# Patient Record
Sex: Female | Born: 1980 | Race: Black or African American | Hispanic: No | Marital: Single | Smoking: Former smoker
Health system: Southern US, Community
[De-identification: ages and names within clinical notes are randomized; demographics above are authoritative.]

## PROBLEM LIST (undated history)

## (undated) ENCOUNTER — Inpatient Hospital Stay (HOSPITAL_COMMUNITY): Payer: Self-pay

## (undated) DIAGNOSIS — A539 Syphilis, unspecified: Secondary | ICD-10-CM

## (undated) DIAGNOSIS — B9689 Other specified bacterial agents as the cause of diseases classified elsewhere: Secondary | ICD-10-CM

## (undated) DIAGNOSIS — N760 Acute vaginitis: Secondary | ICD-10-CM

## (undated) DIAGNOSIS — Z789 Other specified health status: Secondary | ICD-10-CM

## (undated) HISTORY — PX: DILATION AND CURETTAGE OF UTERUS: SHX78

---
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
## 2004-12-10 ENCOUNTER — Emergency Department (HOSPITAL_COMMUNITY): Admission: EM | Admit: 2004-12-10 | Discharge: 2004-12-10 | Payer: Self-pay | Admitting: Family Medicine

## 2005-01-13 ENCOUNTER — Ambulatory Visit (HOSPITAL_COMMUNITY): Admission: RE | Admit: 2005-01-13 | Discharge: 2005-01-13 | Payer: Self-pay | Admitting: *Deleted

## 2005-02-18 ENCOUNTER — Other Ambulatory Visit: Admission: RE | Admit: 2005-02-18 | Discharge: 2005-02-18 | Payer: Self-pay | Admitting: Obstetrics and Gynecology

## 2005-04-09 ENCOUNTER — Inpatient Hospital Stay (HOSPITAL_COMMUNITY): Admission: AD | Admit: 2005-04-09 | Discharge: 2005-04-09 | Payer: Self-pay | Admitting: Obstetrics and Gynecology

## 2005-04-16 ENCOUNTER — Inpatient Hospital Stay (HOSPITAL_COMMUNITY): Admission: AD | Admit: 2005-04-16 | Discharge: 2005-04-20 | Payer: Self-pay | Admitting: *Deleted

## 2005-04-17 ENCOUNTER — Encounter (INDEPENDENT_AMBULATORY_CARE_PROVIDER_SITE_OTHER): Payer: Self-pay | Admitting: Specialist

## 2005-05-17 ENCOUNTER — Other Ambulatory Visit: Admission: RE | Admit: 2005-05-17 | Discharge: 2005-05-17 | Payer: Self-pay | Admitting: Obstetrics and Gynecology

## 2006-08-06 ENCOUNTER — Emergency Department (HOSPITAL_COMMUNITY): Admission: EM | Admit: 2006-08-06 | Discharge: 2006-08-06 | Payer: Self-pay | Admitting: Family Medicine

## 2008-05-01 ENCOUNTER — Emergency Department (HOSPITAL_COMMUNITY): Admission: EM | Admit: 2008-05-01 | Discharge: 2008-05-01 | Payer: Self-pay | Admitting: Emergency Medicine

## 2008-11-18 ENCOUNTER — Emergency Department (HOSPITAL_COMMUNITY): Admission: EM | Admit: 2008-11-18 | Discharge: 2008-11-18 | Payer: Self-pay | Admitting: Family Medicine

## 2009-02-09 ENCOUNTER — Emergency Department (HOSPITAL_COMMUNITY): Admission: EM | Admit: 2009-02-09 | Discharge: 2009-02-09 | Payer: Self-pay | Admitting: Family Medicine

## 2009-02-10 ENCOUNTER — Emergency Department (HOSPITAL_COMMUNITY): Admission: EM | Admit: 2009-02-10 | Discharge: 2009-02-10 | Payer: Self-pay | Admitting: Family Medicine

## 2009-05-19 ENCOUNTER — Emergency Department (HOSPITAL_COMMUNITY): Admission: EM | Admit: 2009-05-19 | Discharge: 2009-05-19 | Payer: Self-pay | Admitting: Emergency Medicine

## 2009-11-04 ENCOUNTER — Inpatient Hospital Stay (HOSPITAL_COMMUNITY): Admission: AD | Admit: 2009-11-04 | Discharge: 2009-11-04 | Payer: Self-pay | Admitting: Obstetrics & Gynecology

## 2009-11-04 ENCOUNTER — Ambulatory Visit: Payer: Self-pay | Admitting: Family Medicine

## 2009-12-05 ENCOUNTER — Inpatient Hospital Stay (HOSPITAL_COMMUNITY): Admission: AD | Admit: 2009-12-05 | Discharge: 2009-12-05 | Payer: Self-pay | Admitting: Obstetrics & Gynecology

## 2009-12-05 ENCOUNTER — Ambulatory Visit: Payer: Self-pay | Admitting: Obstetrics & Gynecology

## 2010-02-12 ENCOUNTER — Inpatient Hospital Stay (HOSPITAL_COMMUNITY)
Admission: AD | Admit: 2010-02-12 | Discharge: 2010-02-15 | Payer: Self-pay | Source: Home / Self Care | Attending: Obstetrics and Gynecology | Admitting: Obstetrics and Gynecology

## 2010-02-13 ENCOUNTER — Encounter (INDEPENDENT_AMBULATORY_CARE_PROVIDER_SITE_OTHER): Payer: Self-pay | Admitting: Obstetrics and Gynecology

## 2010-05-18 LAB — CBC
HCT: 27.4 % — ABNORMAL LOW (ref 36.0–46.0)
HCT: 32.5 % — ABNORMAL LOW (ref 36.0–46.0)
Hemoglobin: 11.1 g/dL — ABNORMAL LOW (ref 12.0–15.0)
Hemoglobin: 9.4 g/dL — ABNORMAL LOW (ref 12.0–15.0)
MCH: 32.9 pg (ref 26.0–34.0)
MCH: 32.9 pg (ref 26.0–34.0)
MCHC: 34.2 g/dL (ref 30.0–36.0)
MCHC: 34.3 g/dL (ref 30.0–36.0)
MCV: 95.9 fL (ref 78.0–100.0)
MCV: 96 fL (ref 78.0–100.0)
Platelets: 204 10*3/uL (ref 150–400)
Platelets: 234 10*3/uL (ref 150–400)
RBC: 2.85 MIL/uL — ABNORMAL LOW (ref 3.87–5.11)
RBC: 3.38 MIL/uL — ABNORMAL LOW (ref 3.87–5.11)
RDW: 13.5 % (ref 11.5–15.5)
RDW: 13.8 % (ref 11.5–15.5)
WBC: 6.5 10*3/uL (ref 4.0–10.5)
WBC: 7.2 10*3/uL (ref 4.0–10.5)

## 2010-05-18 LAB — RPR: RPR Ser Ql: NONREACTIVE

## 2010-05-18 LAB — HEMOGLOBIN A1C
Hgb A1c MFr Bld: 5.4 % (ref <5.7)
Mean Plasma Glucose: 108 mg/dL (ref <117)

## 2010-05-21 LAB — DIFFERENTIAL
Eosinophils Absolute: 0.1 10*3/uL (ref 0.0–0.7)
Lymphocytes Relative: 29 % (ref 12–46)
Lymphs Abs: 2.1 10*3/uL (ref 0.7–4.0)
Monocytes Relative: 8 % (ref 3–12)
Neutro Abs: 4.4 10*3/uL (ref 1.7–7.7)
Neutrophils Relative %: 62 % (ref 43–77)

## 2010-05-21 LAB — URINALYSIS, ROUTINE W REFLEX MICROSCOPIC
Ketones, ur: NEGATIVE mg/dL
Nitrite: NEGATIVE
Nitrite: NEGATIVE
Protein, ur: NEGATIVE mg/dL
Specific Gravity, Urine: 1.02 (ref 1.005–1.030)
Urobilinogen, UA: 0.2 mg/dL (ref 0.0–1.0)
pH: 7.5 (ref 5.0–8.0)
pH: 6.5 (ref 5.0–8.0)

## 2010-05-21 LAB — WET PREP, GENITAL: Yeast Wet Prep HPF POC: NONE SEEN

## 2010-05-21 LAB — HIV ANTIBODY (ROUTINE TESTING W REFLEX): HIV: NONREACTIVE

## 2010-05-21 LAB — CBC
Hemoglobin: 9.9 g/dL — ABNORMAL LOW (ref 12.0–15.0)
Platelets: 225 10*3/uL (ref 150–400)
RBC: 3.02 MIL/uL — ABNORMAL LOW (ref 3.87–5.11)
WBC: 7.1 10*3/uL (ref 4.0–10.5)

## 2010-05-21 LAB — SICKLE CELL SCREEN: Sickle Cell Screen: NEGATIVE

## 2010-05-21 LAB — RPR: RPR Ser Ql: NONREACTIVE

## 2010-05-21 LAB — HEPATITIS B SURFACE ANTIGEN: Hepatitis B Surface Ag: NEGATIVE

## 2010-05-29 LAB — POCT URINALYSIS DIP (DEVICE)
Glucose, UA: NEGATIVE mg/dL
Nitrite: NEGATIVE
Urobilinogen, UA: 0.2 mg/dL (ref 0.0–1.0)

## 2010-05-29 LAB — WET PREP, GENITAL
Clue Cells Wet Prep HPF POC: NONE SEEN
WBC, Wet Prep HPF POC: NONE SEEN

## 2010-05-29 LAB — GC/CHLAMYDIA PROBE AMP, GENITAL: Chlamydia, DNA Probe: NEGATIVE

## 2010-05-29 LAB — POCT PREGNANCY, URINE: Preg Test, Ur: NEGATIVE

## 2010-06-09 LAB — WET PREP, GENITAL: Yeast Wet Prep HPF POC: NONE SEEN

## 2010-06-09 LAB — POCT URINALYSIS DIP (DEVICE)
Hgb urine dipstick: NEGATIVE
Ketones, ur: 15 mg/dL — AB
Specific Gravity, Urine: 1.015 (ref 1.005–1.030)
pH: 6 (ref 5.0–8.0)

## 2010-06-09 LAB — T.PALLIDUM AB, IGG: T pallidum Antibodies (TP-PA): 23.7 IV — ABNORMAL HIGH (ref <1.0)

## 2010-06-09 LAB — RPR TITER: RPR Titer: 1:128 {titer} — AB

## 2010-07-24 NOTE — Op Note (Signed)
[name], [name] [name] NO.:  [id number]   MEDICAL RECORD NO.:  [id number]          PATIENT TYPE:  INP   LOCATION:  9124                          FACILITY:  [facility]   PHYSICIAN:  James A. Ashley Royalty, M.D.DATE OF BIRTH:  1980-05-08   DATE OF PROCEDURE:  04/17/2005  DATE OF DISCHARGE:                                 OPERATIVE REPORT   PREOPERATIVE DIAGNOSIS:  1.  Intrauterine pregnancy at 39 weeks' gestation.  2.  Premature rupture of membranes.  3.  Nonreassuring fetal heart tracing and labor.   POSTOPERATIVE DIAGNOSIS:  1.  Intrauterine pregnancy at 24 weeks' gestation.  2.  Premature rupture of membranes.  3.  Nonreassuring fetal heart tracing and labor.   PROCEDURE:  Primary low transverse cesarean section.   SURGEON:  Rudy Jew. Ashley Royalty, M.D.   ANESTHESIA:  Epidural   FINDINGS:  5 pounds 14 ounces female Apgars 8 at 1 minute and 9 at 5 minutes  sent to newborn nursery. Arterial cord pH 7.34.   ESTIMATED BLOOD LOSS:  600 mL.   COMPLICATIONS:  None.   DRAINS:  Foley.   Needle, sponge, and instrument counts correct x2.   PROCEDURE:  The patient was taken to the operating room, placed in the  dorsosupine position. Epidural anesthetic was dosed to surgical levels. She  was prepped and draped in usual manner for abdominal surgery. Foley catheter  had been previously placed.   Pfannenstiel incision was made down to the level of the fascia which was  nicked with knife and incised transversely with Mayo scissors. The  underlying rectus muscles were separated from the fascia using sharp and  blunt dissection. Rectus muscles were separated in midline exposing  peritoneum which was elevated with hemostats and entered atraumatically with  Metzenbaum scissors. The incision was extended longitudinally. Uterus was  identified and bladder flap created by incising intrauterine serosa and  sharply and bluntly dissecting the bladder inferiorly. It was held in place  with  bladder blade. The uterus then entered through a low transverse  incision using sharp and blunt dissection. Fluid was noted to be clear. The  infant was delivered from vertex presentation. The infant was suctioned. The  cord was triply clamped, cut and infant given immediately to the awaiting  pediatrics team. Arterial cord pH was obtained from isolated segment of  umbilical cord. And regular cord blood was obtained. Placenta and membranes  were removed in their entirety and submitted to pathology for histologic  studies. Uterus was exteriorized.   The uterus was then closed in two running layers of #1 Vicryl. The first was  running locking layer. The second was a running, intermittently locking, and  imbricating layer. One additional figure-of-eight suture was required to  obtain hemostasis. Hemostasis was noted. Uterus, tubes and ovaries inspected  and found to be otherwise unremarkable. They were returned to the abdominal  cavity. Copious irrigation was accomplished. Hemostasis was noted.  Peritoneum was then closed with 3-0 Vicryl in running fashion. The fascia  was closed with 0 Vicryl in a running fashion. The skin was closed with  staples. The patient  tolerated procedure extremely well. At the conclusion  of procedure the urine was clear and copious.   The patient was then taken to the recovery room in excellent condition.      James A. Ashley Royalty, M.D.  Electronically Signed     JAM/MEDQ  D:  04/17/2005  T:  04/18/2005  Job:  [id number]

## 2010-07-24 NOTE — Discharge Summary (Signed)
[name] [name], [name] [name] NO.:  [id number]   MEDICAL RECORD NO.:  [id number]          PATIENT TYPE:  INP   LOCATION:  9124                          FACILITY:  [facility]   PHYSICIAN:  James A. Ashley Royalty, M.D.DATE OF BIRTH:  May 25, 1980   DATE OF ADMISSION:  04/16/2005  DATE OF DISCHARGE:  04/20/2005                                 DISCHARGE SUMMARY   DISCHARGE DIAGNOSES:  1.  Intrauterine pregnancy at [redacted] weeks gestation.  2.  Premature rupture of membranes.  3.  Nonreassuring fetal heart tracing necessitating cesarean section.   OPERATION:  Primary low transverse cesarean section.   CONSULTATIONS:  None.   DISCHARGE MEDICATIONS:  1.  Motrin 600 mg.  2.  Chromagen 1 p.o. daily.   HISTORY OF PRESENT ILLNESS:  This is a 30 year old, gravida 2, para 0-0-1-0  at [redacted] weeks gestation. Prenatal care complicated by late transfer from the  [facility] to [facility],  marijuana use during pregnancy, history of abnormal Paps, history of  trichomonas, treated, history of group B strep in early pregnancy. The  patient presented to the office complaining of possible rupture of membranes  occuring at 3 a.m. on the day of presentation. Rupture of membranes was not  proven in the office. However, the patient went to maternity admissions  later in the day on the day of admission and was noted to be fern positive  at that time. For the remainder of the history and physical, please see  chart.   HOSPITAL COURSE:  The patient was admitted to [facility]. Admission laboratory studies were drawn. On April 17, 2005,  she was noted to be 3 cm dilated, 90% effaced, -1 station, vertex  presentation, intrauterine pressure catheter was placed. Antibiotic  prophylaxis was given. At 11:40 a.m. on April 17, 2005, I was called  regarding evaluation of the fetal heart pattern. At that time, the cervix  was 4-5 cm dilated, 90%  effaced, 0 station, and a vertex presentation.  Variable decelerations were noted. Close observation was continued. At 12:15  on the same day, the patient was noted to have late decelerations which were  persistent despite conservative measures. Hence a decision was made to  proceed with cesarean section. On April 17, 2005, the patient was taken  to the operating room and underwent primary low transverse cesarean section.  The procedure yielded a 5 pound 14 ounce female, Apgar 8 at 1 minute, 9 at 5  minutes, sent to the newborn nursery. The delivery was accomplished by Dr.  Sylvester Harder. The patient's postoperative course was benign except for an  asymptomatic anemia. She was discharged home April 20, 2005, afebrile and  in satisfactory condition on the aforementioned medications. Last hemoglobin  obtained April 20, 2005 was 8.4.   DISPOSITION:  The patient is to return to the [facility] in approximately 6 weeks for postpartum evaluation.      James A. Ashley Royalty, M.D.  Electronically Signed     JAM/MEDQ  D:  06/07/2005  T:  06/09/2005  Job:  [id number]

## 2010-07-24 NOTE — Op Note (Signed)
[facility]  Patient:    [name] [name], [name] [name]                        MRN: [id number] Proc. Date: 05/18/00 Adm. Date:  [id number] Attending:  Tammi Sou CC:         [facility]   Operative Report  PREOPERATIVE DIAGNOSIS:       Nine week intrauterine fetal demise by ultrasound.  POSTOPERATIVE DIAGNOSIS:      Nine week intrauterine fetal demise by ultrasound.  PROCEDURE:                    Suction dilation and evacuation.  SURGEON:                      Charles A. Clearance Coots, M.D.  ANESTHESIA:                   MAC with paracervical block.  ESTIMATED BLOOD LOSS:         100 ml.  COMPLICATIONS:                None.  SPECIMENS:                    Products of conception.  DESCRIPTION OF PROCEDURE:     The patient was brought to the operating room and, after satisfactory IV sedation in the supine position, the legs were brought up in stirrups and the vagina was prepped and draped in the usual sterile fashion.  The urinary bladder was emptied of approximately 25 cc of clear urine.  Bimanual examination revealed the uterus to be mid position and approximately 9-10 weeks in size.  A sterile speculum was inserted in the vaginal vault and the cervix was isolated.  The anterior lip of the cervix was grasped with a single tooth tenaculum.  Paracervical block of 2% Xylocaine with sodium bicarbonate was injected in the cervical stroma in the lateral fornices at the 3 and 9 oclock positions with approximately 8 ml at 3 and 9 oclock.  Approximate 4-5 ml was injected into the anterior lip of the cervix prior to application of the single tooth tenaculum.  The uterus was sounded to confirm the axis.  The cervix was dilated to a #25 Pratt dilator.  A #8 suction catheter was easily introduced into the uterine cavity and all contents were evacuated.  The uterus contracted down quite well.  The endometrial surface was curetted with a  small serrated curet.  No further products of conception were obtained.  The endometrial surface felt gritty throughout.  There was no active bleeding noted.  All instruments were then retired.  The patient tolerated the procedure well and was transported to the recovery room in satisfactory condition. DD:  05/18/00 TD:  05/19/00 Job: [id number]

## 2010-11-23 ENCOUNTER — Emergency Department (HOSPITAL_COMMUNITY)
Admission: EM | Admit: 2010-11-23 | Discharge: 2010-11-24 | Disposition: A | Discharge status: Home / Self Care | Payer: Self-pay | Attending: Emergency Medicine | Admitting: Emergency Medicine

## 2010-11-23 ENCOUNTER — Ambulatory Visit (HOSPITAL_COMMUNITY): Payer: Self-pay

## 2010-11-23 DIAGNOSIS — L293 Anogenital pruritus, unspecified: Secondary | ICD-10-CM | POA: Insufficient documentation

## 2010-11-23 DIAGNOSIS — A499 Bacterial infection, unspecified: Secondary | ICD-10-CM | POA: Insufficient documentation

## 2010-11-23 DIAGNOSIS — N760 Acute vaginitis: Secondary | ICD-10-CM | POA: Insufficient documentation

## 2010-11-23 DIAGNOSIS — B9689 Other specified bacterial agents as the cause of diseases classified elsewhere: Secondary | ICD-10-CM | POA: Insufficient documentation

## 2010-11-23 LAB — URINALYSIS, ROUTINE W REFLEX MICROSCOPIC
Glucose, UA: NEGATIVE mg/dL
Leukocytes, UA: NEGATIVE
Nitrite: NEGATIVE
Specific Gravity, Urine: 1.038 — ABNORMAL HIGH (ref 1.005–1.030)
pH: 5.5 (ref 5.0–8.0)

## 2010-11-23 LAB — URINE MICROSCOPIC-ADD ON

## 2010-11-24 LAB — GC/CHLAMYDIA PROBE AMP, GENITAL
Chlamydia, DNA Probe: NEGATIVE
GC Probe Amp, Genital: NEGATIVE

## 2012-02-09 ENCOUNTER — Encounter (HOSPITAL_COMMUNITY): Payer: Self-pay | Admitting: Emergency Medicine

## 2012-02-09 ENCOUNTER — Emergency Department (HOSPITAL_COMMUNITY)
Admission: EM | Admit: 2012-02-09 | Discharge: 2012-02-09 | Disposition: A | Discharge status: Home / Self Care | Payer: Self-pay | Attending: Emergency Medicine | Admitting: Emergency Medicine

## 2012-02-09 DIAGNOSIS — H1045 Other chronic allergic conjunctivitis: Secondary | ICD-10-CM | POA: Insufficient documentation

## 2012-02-09 DIAGNOSIS — Z3202 Encounter for pregnancy test, result negative: Secondary | ICD-10-CM | POA: Insufficient documentation

## 2012-02-09 DIAGNOSIS — F17200 Nicotine dependence, unspecified, uncomplicated: Secondary | ICD-10-CM | POA: Insufficient documentation

## 2012-02-09 DIAGNOSIS — H1011 Acute atopic conjunctivitis, right eye: Secondary | ICD-10-CM

## 2012-02-09 DIAGNOSIS — H81399 Other peripheral vertigo, unspecified ear: Secondary | ICD-10-CM | POA: Insufficient documentation

## 2012-02-09 LAB — POCT PREGNANCY, URINE: Preg Test, Ur: NEGATIVE

## 2012-02-09 MED ORDER — OLOPATADINE HCL 0.1 % OP SOLN: 1.0000 [drp] | Freq: Two times a day (BID) | OPHTHALMIC | Status: DC

## 2012-02-09 MED ORDER — MECLIZINE HCL 25 MG PO TABS: 25.0000 mg | ORAL_TABLET | Freq: Four times a day (QID) | ORAL | Status: DC | PRN

## 2012-02-09 NOTE — ED Notes (Signed)
Pt c/o right eye redness and irritation x 3 days; pt sts thinks it could be pink eye and pt also requests pregnancy test
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
## 2012-02-19 ENCOUNTER — Emergency Department (HOSPITAL_COMMUNITY): Payer: Self-pay

## 2012-02-19 ENCOUNTER — Emergency Department (HOSPITAL_COMMUNITY)
Admission: EM | Admit: 2012-02-19 | Discharge: 2012-02-19 | Disposition: A | Discharge status: Home / Self Care | Payer: Self-pay | Attending: Emergency Medicine | Admitting: Emergency Medicine

## 2012-02-19 ENCOUNTER — Encounter (HOSPITAL_COMMUNITY): Payer: Self-pay | Admitting: Physical Medicine and Rehabilitation

## 2012-02-19 DIAGNOSIS — S62609A Fracture of unspecified phalanx of unspecified finger, initial encounter for closed fracture: Secondary | ICD-10-CM

## 2012-02-19 DIAGNOSIS — F17200 Nicotine dependence, unspecified, uncomplicated: Secondary | ICD-10-CM | POA: Insufficient documentation

## 2012-02-19 MED ORDER — HYDROCODONE-ACETAMINOPHEN 5-325 MG PO TABS
2.0000 | ORAL_TABLET | Freq: Once | ORAL | Status: AC
  Administered 2012-02-19: 2 via ORAL
  Filled 2012-02-19: qty 2

## 2012-02-19 MED ORDER — HYDROCODONE-ACETAMINOPHEN 5-500 MG PO TABS: 1.0000 | ORAL_TABLET | Freq: Four times a day (QID) | ORAL | Status: DC | PRN
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
## 2012-02-19 NOTE — ED Notes (Signed)
Paged ortho 

## 2012-02-19 NOTE — ED Notes (Signed)
Ortho at bedside applying splint now.
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
## 2012-02-19 NOTE — ED Notes (Signed)
Spoke with ortho, on way to put splint on pt's hand.

## 2012-02-19 NOTE — Progress Notes (Signed)
Orthopedic Tech Progress Note Patient Details:  KENDA KLOEHN 1980-03-10 [id number]  Ortho Devices Type of Ortho Device: Ulna gutter splint Ortho Device/Splint Location: right ulna gutter splint Ortho Device/Splint Interventions: Application   Cammer, Mickie Bail 02/19/2012, 2:31 PM

## 2012-02-22 ENCOUNTER — Emergency Department (HOSPITAL_COMMUNITY)
Admission: EM | Admit: 2012-02-22 | Discharge: 2012-02-22 | Disposition: A | Discharge status: Home / Self Care | Payer: Self-pay | Attending: Emergency Medicine | Admitting: Emergency Medicine

## 2012-02-22 ENCOUNTER — Encounter (HOSPITAL_COMMUNITY): Payer: Self-pay | Admitting: *Deleted

## 2012-02-22 DIAGNOSIS — R350 Frequency of micturition: Secondary | ICD-10-CM | POA: Insufficient documentation

## 2012-02-22 DIAGNOSIS — N390 Urinary tract infection, site not specified: Secondary | ICD-10-CM

## 2012-02-22 DIAGNOSIS — F17200 Nicotine dependence, unspecified, uncomplicated: Secondary | ICD-10-CM | POA: Insufficient documentation

## 2012-02-22 DIAGNOSIS — R319 Hematuria, unspecified: Secondary | ICD-10-CM | POA: Insufficient documentation

## 2012-02-22 LAB — URINALYSIS, ROUTINE W REFLEX MICROSCOPIC
Bilirubin Urine: NEGATIVE
Nitrite: POSITIVE — AB
Protein, ur: 100 mg/dL — AB
Specific Gravity, Urine: 1.027 (ref 1.005–1.030)
Urobilinogen, UA: 1 mg/dL (ref 0.0–1.0)

## 2012-02-22 LAB — URINE MICROSCOPIC-ADD ON

## 2012-02-22 LAB — PREGNANCY, URINE: Preg Test, Ur: NEGATIVE

## 2012-02-22 MED ORDER — KETOROLAC TROMETHAMINE 30 MG/ML IJ SOLN
60.0000 mg | Freq: Once | INTRAMUSCULAR | Status: AC
  Administered 2012-02-22: 60 mg via INTRAMUSCULAR
  Filled 2012-02-22: qty 2

## 2012-02-22 MED ORDER — LIDOCAINE HCL (PF) 1 % IJ SOLN
INTRAMUSCULAR | Status: AC
  Administered 2012-02-22: 2.1 mL
  Filled 2012-02-22: qty 5

## 2012-02-22 MED ORDER — CEFTRIAXONE SODIUM 1 G IJ SOLR
1.0000 g | Freq: Once | INTRAMUSCULAR | Status: AC
  Administered 2012-02-22: 1 g via INTRAMUSCULAR
  Filled 2012-02-22: qty 10

## 2012-02-22 MED ORDER — NITROFURANTOIN MONOHYD MACRO 100 MG PO CAPS: 100.0000 mg | ORAL_CAPSULE | Freq: Two times a day (BID) | ORAL | Status: DC

## 2012-02-22 NOTE — ED Notes (Signed)
The pt has some  Stinging pain when she stops voiding that started yesterday.  She has no vaginal discharge  And no abd pain. No vaginal odor.  lmp 2 days ago. She has some blood when she wipes

## 2012-02-22 NOTE — ED Provider Notes (Signed)
Medical screening examination/treatment/procedure(s) were performed by non-physician practitioner and as supervising physician I was immediately available for consultation/collaboration. Devoria Albe, MD, Armando Gang   Ward Givens, MD 02/22/12 [phone]

## 2012-02-22 NOTE — ED Notes (Signed)
Pt reports dysuria and urinary urgency since yesterday. States that she noticed blood when wiping after urination today. Concerned that she may have a UTI. States urine is cloudy and concentrated looking, too.
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
## 2012-02-24 LAB — URINE CULTURE: Colony Count: >=100000

## 2012-02-25 NOTE — ED Notes (Signed)
+  Urine. Patient given Macrobid. Intermediate. Chart sent to EDP office for review. °

## 2012-02-27 ENCOUNTER — Telehealth (HOSPITAL_COMMUNITY): Payer: Self-pay | Admitting: Emergency Medicine

## 2012-03-04 NOTE — ED Notes (Signed)
Unable to contact patient via phone. Sent letter. °

## 2012-03-04 NOTE — ED Notes (Signed)
Chart returned from EDP office. Per Dahlia Client Muthersbaugh PA-C, if patient still symptomatic change Rx to Cipro 250 mg PO BID #6. No refills. If no persistent symptoms, no need for further treatment.

## 2012-03-08 ENCOUNTER — Telehealth (HOSPITAL_COMMUNITY): Payer: Self-pay | Admitting: Emergency Medicine

## 2012-03-08 NOTE — ED Notes (Signed)
Pt returned call after receiving letter. Notified of positive urine culture. RX Cipro called to [facility] [phone]

## 2012-08-04 ENCOUNTER — Inpatient Hospital Stay (HOSPITAL_COMMUNITY)
Admission: AD | Admit: 2012-08-04 | Discharge: 2012-08-04 | Disposition: A | Discharge status: Home / Self Care | Payer: Self-pay | Source: Ambulatory Visit | Attending: Obstetrics and Gynecology | Admitting: Obstetrics and Gynecology

## 2012-08-04 ENCOUNTER — Encounter (HOSPITAL_COMMUNITY): Payer: Self-pay

## 2012-08-04 DIAGNOSIS — A499 Bacterial infection, unspecified: Secondary | ICD-10-CM

## 2012-08-04 DIAGNOSIS — N760 Acute vaginitis: Secondary | ICD-10-CM | POA: Insufficient documentation

## 2012-08-04 DIAGNOSIS — N949 Unspecified condition associated with female genital organs and menstrual cycle: Secondary | ICD-10-CM | POA: Insufficient documentation

## 2012-08-04 DIAGNOSIS — B9689 Other specified bacterial agents as the cause of diseases classified elsewhere: Secondary | ICD-10-CM | POA: Insufficient documentation

## 2012-08-04 DIAGNOSIS — O2390 Unspecified genitourinary tract infection in pregnancy, unspecified trimester: Secondary | ICD-10-CM | POA: Insufficient documentation

## 2012-08-04 LAB — URINALYSIS, ROUTINE W REFLEX MICROSCOPIC
Bilirubin Urine: NEGATIVE
Glucose, UA: NEGATIVE mg/dL
Ketones, ur: NEGATIVE mg/dL
Leukocytes, UA: NEGATIVE
Protein, ur: NEGATIVE mg/dL
pH: 6 (ref 5.0–8.0)

## 2012-08-04 LAB — POCT PREGNANCY, URINE: Preg Test, Ur: POSITIVE — AB

## 2012-08-04 LAB — WET PREP, GENITAL

## 2012-08-04 MED ORDER — METRONIDAZOLE 500 MG PO TABS: 500.0000 mg | ORAL_TABLET | Freq: Two times a day (BID) | ORAL | Status: DC

## 2012-08-04 NOTE — MAU Note (Signed)
Needs to get paperwork going for preg, found out how far along she is.  +HPT 1-2 wks go.  About the same noticed heavier d/c and odor.  Frequently had BV, is old school- she douches... Discussed with pt that douching can cause BV.

## 2012-08-04 NOTE — MAU Provider Note (Signed)
Attestation of Attending Supervision of Advanced Practitioner: Evaluation and management procedures were performed by the PA/NP/CNM/OB Fellow under my supervision/collaboration. Chart reviewed and agree with management and plan.  Haliyah Fryman V 08/04/2012 8:59 PM

## 2012-08-04 NOTE — MAU Provider Note (Signed)
History     CSN: [id number]  Arrival date and time: 08/04/12 1750   First Provider Initiated Contact with Patient 08/04/12 1855      Chief Complaint  Patient presents with  . Possible Pregnancy  . Vaginal Discharge   HPI 32 y.o. [id number] at [redacted]w[redacted]d by LMP. C/O vaginal discharge with odor. States she gets BV frequently. Uses antibacterial soap and douches regularly.   No past medical history on file.  Past Surgical History  Procedure Laterality Date  . Cesarean section      No family history on file.  History  Substance Use Topics  . Smoking status: Current Every Day Smoker    Types: Cigarettes  . Smokeless tobacco: Not on file  . Alcohol Use: No    Allergies: No Known Allergies  No prescriptions prior to admission    ROS Physical Exam   Blood pressure 121/65, pulse 82, temperature 99 F (37.2 C), temperature source Oral, resp. rate 18, height 5' 2.5" (1.588 m), weight 216 lb (97.977 kg), last menstrual period 06/18/2012.  Physical Exam  Nursing note and vitals reviewed. Constitutional: She is oriented to person, place, and time. She appears well-developed and well-nourished. No distress.  HENT:  Head: Normocephalic and atraumatic.  Cardiovascular: Normal rate.   Respiratory: Effort normal. No respiratory distress.  GI: Soft. She exhibits no distension and no mass. There is no tenderness. There is no rebound and no guarding.  Genitourinary: There is no rash or lesion on the right labia. There is no rash or lesion on the left labia. Uterus is not deviated, not enlarged, not fixed and not tender. Cervix exhibits no motion tenderness, no discharge and no friability. Right adnexum displays no mass, no tenderness and no fullness. Left adnexum displays no mass, no tenderness and no fullness. No erythema, tenderness or bleeding around the vagina. Vaginal discharge (white, malodorous) found.  Neurological: She is alert and oriented to person, place, and time.  Skin: Skin is  warm and dry.  Psychiatric: She has a normal mood and affect.    MAU Course  Procedures Results for orders placed during the hospital encounter of 08/04/12 (from the past 24 hour(s))  URINALYSIS, ROUTINE W REFLEX MICROSCOPIC     Status: Abnormal   Collection Time    08/04/12  6:35 PM      Result Value Range   Color, Urine YELLOW  YELLOW   APPearance CLEAR  CLEAR   Specific Gravity, Urine >1.030 (*) 1.005 - 1.030   pH 6.0  5.0 - 8.0   Glucose, UA NEGATIVE  NEGATIVE mg/dL   Hgb urine dipstick NEGATIVE  NEGATIVE   Bilirubin Urine NEGATIVE  NEGATIVE   Ketones, ur NEGATIVE  NEGATIVE mg/dL   Protein, ur NEGATIVE  NEGATIVE mg/dL   Urobilinogen, UA 1.0  0.0 - 1.0 mg/dL   Nitrite NEGATIVE  NEGATIVE   Leukocytes, UA NEGATIVE  NEGATIVE  POCT PREGNANCY, URINE     Status: Abnormal   Collection Time    08/04/12  6:37 PM      Result Value Range   Preg Test, Ur POSITIVE (*) NEGATIVE  WET PREP, GENITAL     Status: Abnormal   Collection Time    08/04/12  6:56 PM      Result Value Range   Yeast Wet Prep HPF POC NONE SEEN  NONE SEEN   Trich, Wet Prep NONE SEEN  NONE SEEN   Clue Cells Wet Prep HPF POC MODERATE (*) NONE SEEN   WBC,  Wet Prep HPF POC FEW (*) NONE SEEN     Assessment and Plan   1. BV (bacterial vaginosis)   No antibacterial soaps, no douching. Flagyl rx. Pregnancy verification given, start care ASAP.     Medication List    TAKE these medications       BIOTIN PO  Take 1 tablet by mouth daily.     HAIR/SKIN/NAILS PO  Take 1 tablet by mouth daily.     metroNIDAZOLE 500 MG tablet  Commonly known as:  FLAGYL  Take 1 tablet (500 mg total) by mouth 2 (two) times daily.     nitrofurantoin (macrocrystal-monohydrate) 100 MG capsule  Commonly known as:  MACROBID  Take 1 capsule (100 mg total) by mouth 2 (two) times daily. X 7 days        Follow-up Information   Schedule an appointment as soon as possible for a visit with [facility].   Contact information:   [address] [place] [address] [phone]        Krosby Ritchie 08/04/2012, 7:30 PM

## 2012-08-05 LAB — GC/CHLAMYDIA PROBE AMP: GC Probe RNA: NEGATIVE

## 2012-09-04 ENCOUNTER — Inpatient Hospital Stay (HOSPITAL_COMMUNITY): Payer: Self-pay

## 2012-09-04 ENCOUNTER — Inpatient Hospital Stay (HOSPITAL_COMMUNITY)
Admission: AD | Admit: 2012-09-04 | Discharge: 2012-09-04 | Disposition: A | Discharge status: Home / Self Care | Payer: Self-pay | Source: Ambulatory Visit | Attending: Obstetrics and Gynecology | Admitting: Obstetrics and Gynecology

## 2012-09-04 ENCOUNTER — Encounter (HOSPITAL_COMMUNITY): Payer: Self-pay | Admitting: *Deleted

## 2012-09-04 DIAGNOSIS — O039 Complete or unspecified spontaneous abortion without complication: Secondary | ICD-10-CM | POA: Insufficient documentation

## 2012-09-04 DIAGNOSIS — R109 Unspecified abdominal pain: Secondary | ICD-10-CM | POA: Insufficient documentation

## 2012-09-04 LAB — CBC
HCT: 32.5 % — ABNORMAL LOW (ref 36.0–46.0)
Hemoglobin: 11.2 g/dL — ABNORMAL LOW (ref 12.0–15.0)
MCH: 30.9 pg (ref 26.0–34.0)
MCHC: 34.5 g/dL (ref 30.0–36.0)
MCV: 89.5 fL (ref 78.0–100.0)
Platelets: 292 10*3/uL (ref 150–400)
RBC: 3.63 MIL/uL — ABNORMAL LOW (ref 3.87–5.11)
RDW: 12.6 % (ref 11.5–15.5)
WBC: 6.5 10*3/uL (ref 4.0–10.5)

## 2012-09-04 LAB — WET PREP, GENITAL: Yeast Wet Prep HPF POC: NONE SEEN

## 2012-09-04 LAB — HCG, QUANTITATIVE, PREGNANCY: hCG, Beta Chain, Quant, S: 4234 m[IU]/mL — ABNORMAL HIGH (ref <5)

## 2012-09-04 MED ORDER — PROMETHAZINE HCL 12.5 MG PO TABS: 12.5000 mg | ORAL_TABLET | Freq: Four times a day (QID) | ORAL | Status: DC | PRN

## 2012-09-04 MED ORDER — MISOPROSTOL 200 MCG PO TABS: ORAL_TABLET | ORAL | Status: DC

## 2012-09-04 MED ORDER — OXYCODONE-ACETAMINOPHEN 5-325 MG PO TABS: 2.0000 | ORAL_TABLET | ORAL | Status: DC | PRN

## 2012-09-04 MED ORDER — IBUPROFEN 800 MG PO TABS: 800.0000 mg | ORAL_TABLET | Freq: Three times a day (TID) | ORAL | Status: DC

## 2012-09-04 NOTE — MAU Note (Signed)
PT SAYS SHE WAS HERE 1 MTH  AGO- NEEDED LETTER-  TO GET [facility].      SHE LOST THE LETTER.   NOW SHE WORKS FOR [employer]-  AND SHE NEEDS ANOTHER  LETTER    THEN TODAY AT WORK AT  730PM- ON LUNCH BREAK-  SHE WENT TO   B-ROOM - SAW REDDISH/ BROWN  BLOOD   IN UNDERWEAR.    THEN SHE CAME HERE.  STARTED  HAVING CRAMPS AT WORK  - SAME TIME.    IN TRIAGE- NO PAD ON-.   UNDERWEAR-   BLOOD- SMALL SMEAR - RED/ BROWN.

## 2012-09-04 NOTE — MAU Provider Note (Signed)
History     CSN: [id number]  Arrival date and time: 09/04/12 2013   None     No chief complaint on file.  HPI [name] is a 32 y.o. [phone] at [redacted]w[redacted]d who presents to MAU today with new onset vaginal bleeding around 1900 today. The patient noted spotting in her underwear while at work. She describes it as brown/red. She is also having mild lower abdominal cramping and "vaginal pain." She states that the pain is 2/10 now. She denies fever, UTI symptoms, N/V/D. She had intercourse last Thursday.   OB History   Grav Para Term Preterm Abortions TAB SAB Ect Mult Living   4 2   1 1    2       History reviewed. No pertinent past medical history.  Past Surgical History  Procedure Laterality Date  . Cesarean section      Family History  Problem Relation Age of Onset  . Diabetes Mother   . Hypertension Mother     History  Substance Use Topics  . Smoking status: Former Smoker    Types: Cigarettes  . Smokeless tobacco: Not on file  . Alcohol Use: No    Allergies: No Known Allergies  Prescriptions prior to admission  Medication Sig Dispense Refill  . metroNIDAZOLE (FLAGYL) 500 MG tablet Take 1 tablet (500 mg total) by mouth 2 (two) times daily.  14 tablet  0  . Prenatal Vit-Fe Fumarate-FA (PRENATAL MULTIVITAMIN) TABS Take 1 tablet by mouth daily at 12 noon.      . [DISCONTINUED] BIOTIN PO Take 1 tablet by mouth daily.      . [DISCONTINUED] Multiple Vitamins-Minerals (HAIR/SKIN/NAILS PO) Take 1 tablet by mouth daily.      . [DISCONTINUED] nitrofurantoin, macrocrystal-monohydrate, (MACROBID) 100 MG capsule Take 1 capsule (100 mg total) by mouth 2 (two) times daily. X 7 days  14 capsule  0    Review of Systems  Constitutional: Negative for fever and malaise/fatigue.  Gastrointestinal: Positive for abdominal pain. Negative for nausea, vomiting, diarrhea and constipation.  Genitourinary: Negative for dysuria, urgency and frequency.       + vaginal bleeding Neg -  vaginal discharge  Neurological: Negative for dizziness.   Physical Exam   Blood pressure 120/69, pulse 80, temperature 98.1 F (36.7 C), temperature source Oral, resp. rate 20, height 5\' 3"  (1.6 m), weight 217 lb 8 oz (98.657 kg), last menstrual period 06/18/2012.  Physical Exam  Constitutional: She is oriented to person, place, and time. She appears well-developed and well-nourished. No distress.  HENT:  Head: Normocephalic and atraumatic.  Cardiovascular: Normal rate, regular rhythm and normal heart sounds.   Respiratory: Effort normal and breath sounds normal. No respiratory distress.  GI: Soft. Bowel sounds are normal. She exhibits no distension and no mass. There is no tenderness. There is no rebound and no guarding.  Genitourinary: Uterus is not enlarged and not tender. Cervix exhibits no motion tenderness, no discharge and no friability. Right adnexum displays no mass and no tenderness. Left adnexum displays no mass and no tenderness. There is bleeding (small amount of brown blood noted in the vaginal vault) around the vagina.  Neurological: She is alert and oriented to person, place, and time.  Skin: Skin is warm and dry. No erythema.  Psychiatric: She has a normal mood and affect.   Results for orders placed during the hospital encounter of 09/04/12 (from the past 24 hour(s))  WET PREP, GENITAL     Status: Abnormal  Collection Time    09/04/12  9:25 PM      Result Value Range   Yeast Wet Prep HPF POC NONE SEEN  NONE SEEN   Trich, Wet Prep NONE SEEN  NONE SEEN   Clue Cells Wet Prep HPF POC NONE SEEN  NONE SEEN   WBC, Wet Prep HPF POC FEW (*) NONE SEEN  CBC     Status: Abnormal   Collection Time    09/04/12  9:45 PM      Result Value Range   WBC 6.5  4.0 - 10.5 K/uL   RBC 3.63 (*) 3.87 - 5.11 MIL/uL   Hemoglobin 11.2 (*) 12.0 - 15.0 g/dL   HCT 16.1 (*) 09.6 - 04.5 %   MCV 89.5  78.0 - 100.0 fL   MCH 30.9  26.0 - 34.0 pg   MCHC 34.5  30.0 - 36.0 g/dL   RDW 40.9  81.1 -  91.4 %   Platelets 292  150 - 400 K/uL  HCG, QUANTITATIVE, PREGNANCY     Status: Abnormal   Collection Time    09/04/12  9:45 PM      Result Value Range   hCG, Beta Chain, Quant, S 4234 (*) <5 mIU/mL   US Ob Comp Less 14 Wks  09/04/2012   *RADIOLOGY REPORT*  Clinical Data: Vaginal bleeding.  Lower abdominal cramping.  No fetal heart tones observed.  OBSTETRIC <14 WK ULTRASOUND  Technique:  Transabdominal ultrasound was performed for evaluation of the gestation as well as the maternal uterus and adnexal regions.  Comparison:  None.  Intrauterine gestational sac: Gestational sac is identified which appears irregular, with point seen near the lower uterine segment. Yolk sac: None. Embryo: None. Cardiac Activity: None.  Maternal uterus/Adnexae: There is a small subchorionic hemorrhage.  Both ovaries appear normal and physiologic.  No free fluid in the anatomic pelvis.  IMPRESSION: Empty gestational sac with irregular margins compatible with failed early pregnancy.  No fetus or yolk sac is identified.  This accounts for the absence of fetal heart tones.   Original Report Authenticated By: Andreas Newport, M.D.   US Ob Transvaginal  09/04/2012   *RADIOLOGY REPORT*  Clinical Data: Vaginal bleeding.  Lower abdominal cramping.  No fetal heart tones observed.  OBSTETRIC <14 WK ULTRASOUND  Technique:  Transabdominal ultrasound was performed for evaluation of the gestation as well as the maternal uterus and adnexal regions.  Comparison:  None.  Intrauterine gestational sac: Gestational sac is identified which appears irregular, with point seen near the lower uterine segment. Yolk sac: None. Embryo: None. Cardiac Activity: None.  Maternal uterus/Adnexae: There is a small subchorionic hemorrhage.  Both ovaries appear normal and physiologic.  No free fluid in the anatomic pelvis.  IMPRESSION: Empty gestational sac with irregular margins compatible with failed early pregnancy.  No fetus or yolk sac is identified.  This  accounts for the absence of fetal heart tones.   Original Report Authenticated By: Andreas Newport, M.D.    MAU Course  Procedures None  MDM Unable to obtain FHTs with doppler in MAu A+ blood type from previous visit in Epic Wet prep, GC/Chlamydia, CBC, quant hCG and [place] today Discussed patient with Dr. Jolayne Panther. Ok to offer Cytotec vs expectant management and follow-up in Clinic       Early Intrauterine Pregnancy Failure  _x__  Documented intrauterine pregnancy failure less than or equal to [redacted] weeks gestation  _x__  No serious current illness  _x__  Baseline Hgb greater than  or equal to 10g/dl  _x__  Patient has easily accessible transportation to the hospital  _x__  Clear preference  _x__  Practitioner/physician deems patient reliable  _x__  Counseling by practitioner or physician  _x__  Patient education by RN  ___  Consent form signed  _N/A__  Rho-Gam given by RN if indicated  _x__ Medication dispensed   _x__   Cytotec 800 mcg  _x_   Intravaginally by patient at home         __   Intravaginally by RN in MAU        __   Rectally by patient at home        __   Rectally by RN in MAU  _x__  Ibuprofen 800 mg 1 tablet by mouth every 6 hours as needed #30  _x__  Hydrocodone/acetaminophen 5/325 mg by mouth every 4 to 6 hours as needed  _x__  Phenergan 12.5 mg by mouth every 4 hours as needed for nausea    Assessment and Plan  A: SAB  P: Discharge home Rx for Cyctotec, Percocet, Ibuprofen and Phenergan given/sent to patient's pharmacy Discussed bleeding precautions Patient referred to [facility] clinic for follow-up in 2 weeks Patient may return to MAU as needed or if her condition were to change or worsen  Freddi Starr, PA-C  09/04/2012, 10:40 PM

## 2012-09-05 NOTE — MAU Provider Note (Signed)
Attestation of Attending Supervision of Advanced Practitioner (CNM/NP): Evaluation and management procedures were performed by the Advanced Practitioner under my supervision and collaboration.  I have reviewed the Advanced Practitioner's note and chart, and I agree with the management and plan.  Charli Halle 09/05/2012 7:58 AM

## 2012-09-12 ENCOUNTER — Inpatient Hospital Stay (HOSPITAL_COMMUNITY): Payer: Self-pay

## 2012-09-12 ENCOUNTER — Encounter (HOSPITAL_COMMUNITY)
Admission: AD | Disposition: A | Discharge status: Home / Self Care | Payer: Self-pay | Source: Ambulatory Visit | Attending: Obstetrics & Gynecology

## 2012-09-12 ENCOUNTER — Inpatient Hospital Stay (HOSPITAL_COMMUNITY): Payer: Self-pay | Admitting: Anesthesiology

## 2012-09-12 ENCOUNTER — Encounter (HOSPITAL_COMMUNITY): Payer: Self-pay | Admitting: *Deleted

## 2012-09-12 ENCOUNTER — Inpatient Hospital Stay (HOSPITAL_COMMUNITY)
Admission: AD | Admit: 2012-09-12 | Discharge: 2012-09-13 | Disposition: A | Discharge status: Home / Self Care | Payer: MEDICAID | Source: Ambulatory Visit | Attending: Obstetrics & Gynecology | Admitting: Obstetrics & Gynecology

## 2012-09-12 ENCOUNTER — Ambulatory Visit: Admit: 2012-09-12 | Payer: Self-pay | Admitting: Obstetrics & Gynecology

## 2012-09-12 ENCOUNTER — Encounter (HOSPITAL_COMMUNITY): Payer: Self-pay | Admitting: Anesthesiology

## 2012-09-12 DIAGNOSIS — O034 Incomplete spontaneous abortion without complication: Secondary | ICD-10-CM | POA: Insufficient documentation

## 2012-09-12 HISTORY — PX: DILATION AND EVACUATION: SHX1459

## 2012-09-12 LAB — CBC
Platelets: 272 10*3/uL (ref 150–400)
RDW: 12.6 % (ref 11.5–15.5)
WBC: 6.9 10*3/uL (ref 4.0–10.5)

## 2012-09-12 LAB — TYPE AND SCREEN: Antibody Screen: NEGATIVE

## 2012-09-12 SURGERY — DILATION AND EVACUATION, UTERUS
Anesthesia: Monitor Anesthesia Care | Site: Vagina | Wound class: Clean Contaminated

## 2012-09-12 MED ORDER — ONDANSETRON HCL 4 MG/2ML IJ SOLN
INTRAMUSCULAR | Status: DC | PRN
  Administered 2012-09-12: 4 mg via INTRAVENOUS

## 2012-09-12 MED ORDER — FAMOTIDINE IN NACL 20-0.9 MG/50ML-% IV SOLN
INTRAVENOUS | Status: AC
  Administered 2012-09-12: 20 mg via INTRAVENOUS
  Filled 2012-09-12: qty 50

## 2012-09-12 MED ORDER — FAMOTIDINE IN NACL 20-0.9 MG/50ML-% IV SOLN: 20.0000 mg | Freq: Once | INTRAVENOUS | Status: AC

## 2012-09-12 MED ORDER — KETOROLAC TROMETHAMINE 30 MG/ML IJ SOLN
INTRAMUSCULAR | Status: DC | PRN
  Administered 2012-09-12: 30 mg via INTRAVENOUS

## 2012-09-12 MED ORDER — FENTANYL CITRATE 0.05 MG/ML IJ SOLN
INTRAMUSCULAR | Status: DC | PRN
  Administered 2012-09-12 (×2): 100 ug via INTRAVENOUS

## 2012-09-12 MED ORDER — DOXYCYCLINE HYCLATE 100 MG IV SOLR
200.0000 mg | Freq: Once | INTRAVENOUS | Status: AC
  Administered 2012-09-12: 200 mg via INTRAVENOUS
  Filled 2012-09-12: qty 200

## 2012-09-12 MED ORDER — SODIUM CHLORIDE 0.9 % IV BOLUS (SEPSIS): 1000.0000 mL | Freq: Once | INTRAVENOUS | Status: DC

## 2012-09-12 MED ORDER — OXYTOCIN 10 UNIT/ML IJ SOLN
INTRAMUSCULAR | Status: DC | PRN
  Administered 2012-09-12: 20 [IU] via INTRAMUSCULAR

## 2012-09-12 MED ORDER — OXYCODONE-ACETAMINOPHEN 5-325 MG PO TABS: 2.0000 | ORAL_TABLET | Freq: Once | ORAL | Status: DC

## 2012-09-12 MED ORDER — MIDAZOLAM HCL 5 MG/5ML IJ SOLN
INTRAMUSCULAR | Status: DC | PRN
  Administered 2012-09-12: 2 mg via INTRAVENOUS

## 2012-09-12 MED ORDER — CITRIC ACID-SODIUM CITRATE 334-500 MG/5ML PO SOLN
ORAL | Status: AC
  Administered 2012-09-12: 30 mL via ORAL
  Filled 2012-09-12: qty 15

## 2012-09-12 MED ORDER — LACTATED RINGERS IV SOLN
INTRAVENOUS | Status: DC
  Administered 2012-09-12: 23:00:00 via INTRAVENOUS

## 2012-09-12 MED ORDER — FENTANYL CITRATE 0.05 MG/ML IJ SOLN: INTRAMUSCULAR | Status: DC | PRN

## 2012-09-12 MED ORDER — DOXYCYCLINE HYCLATE 100 MG IV SOLR: 200.0000 mg | INTRAVENOUS | Status: DC

## 2012-09-12 MED ORDER — HYDROMORPHONE HCL PF 2 MG/ML IJ SOLN
2.0000 mg | Freq: Once | INTRAMUSCULAR | Status: AC
  Administered 2012-09-12: 2 mg via INTRAVENOUS
  Filled 2012-09-12: qty 1

## 2012-09-12 MED ORDER — LIDOCAINE HCL (CARDIAC) 20 MG/ML IV SOLN
INTRAVENOUS | Status: DC | PRN
  Administered 2012-09-12: 50 mg via INTRAVENOUS

## 2012-09-12 MED ORDER — BUPIVACAINE-EPINEPHRINE 0.5% -1:200000 IJ SOLN
INTRAMUSCULAR | Status: DC | PRN
  Administered 2012-09-12: 10 mL

## 2012-09-12 MED ORDER — DEXAMETHASONE SODIUM PHOSPHATE 10 MG/ML IJ SOLN
INTRAMUSCULAR | Status: DC | PRN
  Administered 2012-09-12: 10 mg via INTRAVENOUS

## 2012-09-12 MED ORDER — LACTATED RINGERS IV BOLUS (SEPSIS)
1000.0000 mL | Freq: Once | INTRAVENOUS | Status: AC
  Administered 2012-09-12: 1000 mL via INTRAVENOUS

## 2012-09-12 MED ORDER — PROPOFOL INFUSION 10 MG/ML OPTIME
INTRAVENOUS | Status: DC | PRN
  Administered 2012-09-12: 25 mL via INTRAVENOUS
  Administered 2012-09-12 (×4): 50 mL via INTRAVENOUS

## 2012-09-12 MED ORDER — CITRIC ACID-SODIUM CITRATE 334-500 MG/5ML PO SOLN: 30.0000 mL | Freq: Once | ORAL | Status: AC

## 2012-09-12 SURGICAL SUPPLY — 21 items
CATH ROBINSON RED A/P 16FR (CATHETERS) ×2 IMPLANT
CLOTH BEACON ORANGE TIMEOUT ST (SAFETY) ×2 IMPLANT
DECANTER SPIKE VIAL GLASS SM (MISCELLANEOUS) ×2 IMPLANT
GLOVE BIO SURGEON STRL SZ 6.5 (GLOVE) ×2 IMPLANT
GLOVE BIOGEL PI IND STRL 7.0 (GLOVE) ×1 IMPLANT
GLOVE BIOGEL PI INDICATOR 7.0 (GLOVE) ×1
GOWN STRL REIN XL XLG (GOWN DISPOSABLE) ×4 IMPLANT
KIT BERKELEY 1ST TRIMESTER 3/8 (MISCELLANEOUS) ×2 IMPLANT
NEEDLE SPNL 22GX3.5 QUINCKE BK (NEEDLE) ×2 IMPLANT
NS IRRIG 1000ML POUR BTL (IV SOLUTION) ×2 IMPLANT
PACK VAGINAL MINOR WOMEN LF (CUSTOM PROCEDURE TRAY) ×2 IMPLANT
PAD OB MATERNITY 4.3X12.25 (PERSONAL CARE ITEMS) ×2 IMPLANT
PAD PREP 24X48 CUFFED NSTRL (MISCELLANEOUS) ×2 IMPLANT
SET BERKELEY SUCTION TUBING (SUCTIONS) ×2 IMPLANT
SLEEVE SCD COMPRESS KNEE MED (MISCELLANEOUS) ×2 IMPLANT
SYR CONTROL 10ML LL (SYRINGE) ×2 IMPLANT
TOWEL OR 17X24 6PK STRL BLUE (TOWEL DISPOSABLE) ×4 IMPLANT
VACURETTE 10 RIGID CVD (CANNULA) ×2 IMPLANT
VACURETTE 7MM CVD STRL WRAP (CANNULA) IMPLANT
VACURETTE 8 RIGID CVD (CANNULA) IMPLANT
VACURETTE 9 RIGID CVD (CANNULA) IMPLANT

## 2012-09-12 NOTE — Op Note (Signed)
Procedure: Suction dilation and curettage Preoperative diagnosis: Incomplete abortion at [redacted] weeks gestation Postoperative diagnosis: Same Surgeon: Dr. Scheryl Darter Anesthesia: MAC and intracervical block with local anesthetic Specimen: Products of conception Complications: None Estimated blood loss: 50 mL Counts: Correct   Patient gave written consent for suction dilation and curettage with diagnosis of incomplete was curettaged at [redacted] weeks gestation. She presented with a vaginal bleeding and orthostasis. Patient identification was confirmed and she was brought to the operating room and MAC anesthesia was induced. She was placed in dorsal lithotomy position. Exam revealed six-week size uterus with cervix closed. Speculum was inserted and half percent Marcaine with 1 200,000 epinephrine was infiltrated for an intracervical block. Cervix was grasped with single-tooth tenaculum. Cervix was dilated sufficiently to pass a 10 mm suction curette. Suction curettage was performed and products of conception were obtained. Complete evacuation uterine cavity was assured. She received doxycycline at the start of the case and IV Pitocin during the procedure. All instruments were removed. Patient tolerated the procedure well without complications. She is brought in stable condition to PACU.  Dr. Scheryl Darter 09/12/2012 11:58 PM

## 2012-09-12 NOTE — MAU Provider Note (Signed)
Chief Complaint: Vaginal Bleeding and Dysmenorrhea   First Provider Initiated Contact with Patient 09/12/12 2149     SUBJECTIVE HPI: [name] [name] is a 32 y.o. [id number] female Dx w/ failed pregnancy 09/04/12 who presents to MAU reporting heavy bleeding and severe cramping after taking cytotec today at 1300. States she passed tissue at home, but bleeding has been heavy w/ clots since then. Reports mild dizziness w/out syncope.  History reviewed. No pertinent past medical history. OB History   Grav Para Term Preterm Abortions TAB SAB Ect Mult Living   4 2   1 1    2      # Outc Date GA Lbr Len/2nd Wgt Sex Del Anes PTL Lv   1 PAR     M LTCS   Yes   2 PAR     F LTCS   Yes   3 TAB            4 CUR              Past Surgical History  Procedure Laterality Date  . Cesarean section     History   Social History  . Marital Status: Single    Spouse Name: N/A    Number of Children: N/A  . Years of Education: N/A   Occupational History  . Not on file.   Social History Main Topics  . Smoking status: Former Smoker    Types: Cigarettes  . Smokeless tobacco: Not on file  . Alcohol Use: No  . Drug Use: No     Comment: last used in February  . Sexually Active: Not on file   Other Topics Concern  . Not on file   Social History Narrative  . No narrative on file   No current facility-administered medications on file prior to encounter.   Current Outpatient Prescriptions on File Prior to Encounter  Medication Sig Dispense Refill  . ibuprofen (ADVIL,MOTRIN) 800 MG tablet Take 1 tablet (800 mg total) by mouth 3 (three) times daily.  21 tablet  0  . misoprostol (CYTOTEC) 200 MCG tablet Place 4 tabs (800 mcg) in the vagina once  4 tablet  0  . Prenatal Vit-Fe Fumarate-FA (PRENATAL MULTIVITAMIN) TABS Take 1 tablet by mouth daily at 12 noon.      . metroNIDAZOLE (FLAGYL) 500 MG tablet Take 1 tablet (500 mg total) by mouth 2 (two) times daily.  14 tablet  0  . oxyCODONE-acetaminophen  (PERCOCET/ROXICET) 5-325 MG per tablet Take 2 tablets by mouth every 4 (four) hours as needed for pain.  15 tablet  0  . promethazine (PHENERGAN) 12.5 MG tablet Take 1 tablet (12.5 mg total) by mouth every 6 (six) hours as needed for nausea.  30 tablet  0   No Known Allergies  ROS: Pertinent items in HPI  OBJECTIVE Blood pressure 140/83, pulse 86, temperature 99.6 F (37.6 C), temperature source Oral, resp. rate 18, height 5\' 4"  (1.626 m), weight 98.657 kg (217 lb 8 oz), last menstrual period 06/18/2012. GENERAL: Well-developed, well-nourished female in mild distress.  HEENT: Normocephalic HEART: normal rate RESP: normal effort ABDOMEN: Soft, mild low abdtender EXTREMITIES: Nontender, no edema NEURO: Alert and oriented SPECULUM EXAM: NEFG, small amount of bright red blood and 1 1 cm clot in os. cervix clean BIMANUAL: cervix closed; uterus slightly enlarged, no adnexal tenderness or masses  LAB RESULTS Results for orders placed during the hospital encounter of 09/12/12 (from the past 24 hour(s))  CBC  Status: Abnormal   Collection Time    09/12/12  9:55 PM      Result Value Range   WBC 6.9  4.0 - 10.5 K/uL   RBC 3.37 (*) 3.87 - 5.11 MIL/uL   Hemoglobin 10.5 (*) 12.0 - 15.0 g/dL   HCT 40.9 (*) 81.1 - 91.4 %   MCV 90.5  78.0 - 100.0 fL   MCH 31.2  26.0 - 34.0 pg   MCHC 34.4  30.0 - 36.0 g/dL   RDW 78.2  95.6 - 21.3 %   Platelets 272  150 - 400 K/uL    IMAGING US Ob Comp Less 14 Wks  09/04/2012   *RADIOLOGY REPORT*  Clinical Data: Vaginal bleeding.  Lower abdominal cramping.  No fetal heart tones observed.  OBSTETRIC <14 WK ULTRASOUND  Technique:  Transabdominal ultrasound was performed for evaluation of the gestation as well as the maternal uterus and adnexal regions.  Comparison:  None.  Intrauterine gestational sac: Gestational sac is identified which appears irregular, with point seen near the lower uterine segment. Yolk sac: None. Embryo: None. Cardiac Activity: None.   Maternal uterus/Adnexae: There is a small subchorionic hemorrhage.  Both ovaries appear normal and physiologic.  No free fluid in the anatomic pelvis.  IMPRESSION: Empty gestational sac with irregular margins compatible with failed early pregnancy.  No fetus or yolk sac is identified.  This accounts for the absence of fetal heart tones.   Original Report Authenticated By: Andreas Newport, M.D.   US Ob Transvaginal  09/04/2012   *RADIOLOGY REPORT*  Clinical Data: Vaginal bleeding.  Lower abdominal cramping.  No fetal heart tones observed.  OBSTETRIC <14 WK ULTRASOUND  Technique:  Transabdominal ultrasound was performed for evaluation of the gestation as well as the maternal uterus and adnexal regions.  Comparison:  None.  Intrauterine gestational sac: Gestational sac is identified which appears irregular, with point seen near the lower uterine segment. Yolk sac: None. Embryo: None. Cardiac Activity: None.  Maternal uterus/Adnexae: There is a small subchorionic hemorrhage.  Both ovaries appear normal and physiologic.  No free fluid in the anatomic pelvis.  IMPRESSION: Empty gestational sac with irregular margins compatible with failed early pregnancy.  No fetus or yolk sac is identified.  This accounts for the absence of fetal heart tones.   Original Report Authenticated By: Andreas Newport, M.D.   MAU COURSE 2220: Pt passed out and was caught by RN while during orthostatic VS. Had heavy bleeding immediately prior. Immediately regained consciousness after placed in bad and HOB lowed. A&O x 4. LR bolus started. Large pad 1/2 soaked. Hgb 10.5. T&S pending. [place] ordered changed to BS. Dr. Debroah Loop notified of heavy bleeding, syncope. Agrees w/ POC. Coming to assess pt.  ASSESSMENT Hemorrhage following cyctotec for incomplete AB. Suspect retained POC.  PLAN To OR for D&C. The procedure and the risk of bleeding, uterine damage, anesthesia, pain, infection were discussed and her questions were answered. Dr. Debroah Loop  assuming care of Pt.   [place], CNM 09/12/2012  11:01 PM  Adam Phenix, MD 09/12/2012 11:06 PM

## 2012-09-12 NOTE — Anesthesia Preprocedure Evaluation (Signed)
Anesthesia Evaluation    Airway Mallampati: II TM Distance: >3 FB Neck ROM: full    Dental no notable dental hx. (+) Teeth Intact   Pulmonary Current Smoker,  breath sounds clear to auscultation  Pulmonary exam normal       Cardiovascular negative cardio ROS  Rhythm:regular Rate:Normal     Neuro/Psych negative psych ROS   GI/Hepatic negative GI ROS, Neg liver ROS,   Endo/Other  negative endocrine ROS  Renal/GU negative Renal ROS     Musculoskeletal   Abdominal Normal abdominal exam  (+)   Peds  Hematology negative hematology ROS (+)   Anesthesia Other Findings   Reproductive/Obstetrics Incomplete Ab Previous C/Section                           Anesthesia Physical Anesthesia Plan  ASA: II and emergent  Anesthesia Plan: MAC   Post-op Pain Management:    Induction:   Airway Management Planned:   Additional Equipment:   Intra-op Plan:   Post-operative Plan:   Informed Consent: I have reviewed the patients History and Physical, chart, labs and discussed the procedure including the risks, benefits and alternatives for the proposed anesthesia with the patient or authorized representative who has indicated his/her understanding and acceptance.   Dental Advisory Given  Plan Discussed with: Anesthesiologist, CRNA and Surgeon  Anesthesia Plan Comments:         Anesthesia Quick Evaluation

## 2012-09-12 NOTE — MAU Note (Signed)
Patient states she took Cytotec today around 1pm for a miscarriage and started having heavy bleeding and cramping.

## 2012-09-13 ENCOUNTER — Encounter (HOSPITAL_COMMUNITY): Payer: Self-pay | Admitting: Obstetrics & Gynecology

## 2012-09-13 DIAGNOSIS — O034 Incomplete spontaneous abortion without complication: Secondary | ICD-10-CM

## 2012-09-13 MED ORDER — MEPERIDINE HCL 25 MG/ML IJ SOLN: 6.2500 mg | INTRAMUSCULAR | Status: DC | PRN

## 2012-09-13 MED ORDER — METOCLOPRAMIDE HCL 5 MG/ML IJ SOLN: 10.0000 mg | Freq: Once | INTRAMUSCULAR | Status: DC | PRN

## 2012-09-13 MED ORDER — FENTANYL CITRATE 0.05 MG/ML IJ SOLN: 25.0000 ug | INTRAMUSCULAR | Status: DC | PRN

## 2012-09-13 NOTE — Transfer of Care (Signed)
Immediate Anesthesia Transfer of Care Note  Patient: [name] [name]  Procedure(s) Performed: Procedure(s): DILATATION AND EVACUATION (N/A)  Patient Location: PACU  Anesthesia Type:MAC  Level of Consciousness: awake, alert , oriented and patient cooperative  Airway & Oxygen Therapy: Patient Spontanous Breathing and Patient connected to nasal cannula oxygen  Post-op Assessment: Report given to PACU RN, Post -op Vital signs reviewed and stable and Patient moving all extremities  Post vital signs: Reviewed and stable  Complications: No apparent anesthesia complications

## 2012-09-13 NOTE — Anesthesia Postprocedure Evaluation (Signed)
  Anesthesia Post-op Note  Patient: [name] [name]  Procedure(s) Performed: Procedure(s): DILATATION AND EVACUATION (N/A)  Patient Location: PACU  Anesthesia Type:MAC  Level of Consciousness: awake, alert  and oriented  Airway and Oxygen Therapy: Patient Spontanous Breathing  Post-op Pain: none  Post-op Assessment: Post-op Vital signs reviewed, Patient's Cardiovascular Status Stable, Respiratory Function Stable, Patent Airway, No signs of Nausea or vomiting and Pain level controlled  Post-op Vital Signs: Reviewed and stable  Complications: No apparent anesthesia complications

## 2013-01-04 ENCOUNTER — Inpatient Hospital Stay (HOSPITAL_COMMUNITY): Payer: Medicaid Other

## 2013-01-04 ENCOUNTER — Encounter (HOSPITAL_COMMUNITY): Payer: Self-pay | Admitting: *Deleted

## 2013-01-04 ENCOUNTER — Inpatient Hospital Stay (HOSPITAL_COMMUNITY)
Admission: AD | Admit: 2013-01-04 | Discharge: 2013-01-04 | Disposition: A | Discharge status: Home / Self Care | Payer: Self-pay | Source: Ambulatory Visit | Attending: Family Medicine | Admitting: Family Medicine

## 2013-01-04 DIAGNOSIS — R109 Unspecified abdominal pain: Secondary | ICD-10-CM | POA: Insufficient documentation

## 2013-01-04 DIAGNOSIS — B9689 Other specified bacterial agents as the cause of diseases classified elsewhere: Secondary | ICD-10-CM

## 2013-01-04 DIAGNOSIS — A499 Bacterial infection, unspecified: Secondary | ICD-10-CM | POA: Insufficient documentation

## 2013-01-04 DIAGNOSIS — O209 Hemorrhage in early pregnancy, unspecified: Secondary | ICD-10-CM | POA: Insufficient documentation

## 2013-01-04 DIAGNOSIS — N949 Unspecified condition associated with female genital organs and menstrual cycle: Secondary | ICD-10-CM | POA: Insufficient documentation

## 2013-01-04 DIAGNOSIS — O2390 Unspecified genitourinary tract infection in pregnancy, unspecified trimester: Secondary | ICD-10-CM | POA: Insufficient documentation

## 2013-01-04 DIAGNOSIS — N760 Acute vaginitis: Secondary | ICD-10-CM

## 2013-01-04 LAB — CBC
HCT: 31.1 % — ABNORMAL LOW (ref 36.0–46.0)
Hemoglobin: 10.8 g/dL — ABNORMAL LOW (ref 12.0–15.0)
MCH: 30 pg (ref 26.0–34.0)
MCHC: 34.7 g/dL (ref 30.0–36.0)
MCV: 86.4 fL (ref 78.0–100.0)
Platelets: 315 10*3/uL (ref 150–400)
RBC: 3.6 MIL/uL — ABNORMAL LOW (ref 3.87–5.11)
RDW: 13.2 % (ref 11.5–15.5)
WBC: 7.1 10*3/uL (ref 4.0–10.5)

## 2013-01-04 LAB — WET PREP, GENITAL: Yeast Wet Prep HPF POC: NONE SEEN

## 2013-01-04 LAB — URINALYSIS, ROUTINE W REFLEX MICROSCOPIC
Bilirubin Urine: NEGATIVE
Glucose, UA: NEGATIVE mg/dL
Hgb urine dipstick: NEGATIVE
Protein, ur: NEGATIVE mg/dL
Specific Gravity, Urine: >1.03 — ABNORMAL HIGH (ref 1.005–1.030)

## 2013-01-04 LAB — HCG, QUANTITATIVE, PREGNANCY: hCG, Beta Chain, Quant, S: 10569 m[IU]/mL — ABNORMAL HIGH

## 2013-01-04 LAB — POCT PREGNANCY, URINE: Preg Test, Ur: POSITIVE — AB

## 2013-01-04 MED ORDER — METRONIDAZOLE 500 MG PO TABS: 500.0000 mg | ORAL_TABLET | Freq: Two times a day (BID) | ORAL | Status: DC

## 2013-01-04 NOTE — MAU Provider Note (Signed)
Chart reviewed and agree with management and plan.  

## 2013-01-04 NOTE — MAU Provider Note (Signed)
History     CSN: [id number]  Arrival date and time: 01/04/13 1916   First Provider Initiated Contact with Patient 01/04/13 1951      Chief Complaint  Patient presents with  . Possible Pregnancy  . Vaginal Discharge   HPI [name] is a 32 y.o. [phone] at [redacted]w[redacted]d who presents to MAU today with complaint of malodorous vaginal discharge. The patient states that she has noted a white and often brown discharge vaginally x 2-3 days. She states that she is also having occasional mild lower abdominal cramping. Patient states LMP 11/28/12. +HPT on Sunday. She denies N/V/D or constipation, fever or dizziness today.    OB History   Grav Para Term Preterm Abortions TAB SAB Ect Mult Living   4 2   1 1    2       History reviewed. No pertinent past medical history.  Past Surgical History  Procedure Laterality Date  . Cesarean section    . Dilation and evacuation N/A 09/12/2012    Procedure: DILATATION AND EVACUATION;  Surgeon: Adam Phenix, MD;  Location: [facility];  Service: Gynecology;  Laterality: N/A;    Family History  Problem Relation Age of Onset  . Diabetes Mother   . Hypertension Mother     History  Substance Use Topics  . Smoking status: Former Smoker    Types: Cigarettes  . Smokeless tobacco: Not on file  . Alcohol Use: No    Allergies: No Known Allergies  No prescriptions prior to admission    Review of Systems  Constitutional: Negative for fever and malaise/fatigue.  Gastrointestinal: Positive for abdominal pain. Negative for nausea, vomiting, diarrhea and constipation.  Genitourinary: Negative for dysuria, urgency and frequency.       + vaginal discharge, bleeding   Physical Exam   Blood pressure 142/75, pulse 89, temperature 98 F (36.7 C), temperature source Oral, resp. rate 20, height 5\' 3"  (1.6 m), weight 225 lb (102.059 kg), last menstrual period 11/28/2012, SpO2 100.00%.  Physical Exam  Constitutional: She is oriented to person, place, and  time. She appears well-developed and well-nourished. No distress.  HENT:  Head: Normocephalic and atraumatic.  Cardiovascular: Normal rate.   Respiratory: Effort normal.  GI: Soft. She exhibits no distension and no mass. There is no tenderness. There is no rebound and no guarding.  Genitourinary: Uterus is not enlarged and not tender. Cervix exhibits no motion tenderness, no discharge and no friability. Right adnexum displays no mass and no tenderness. Left adnexum displays no mass and no tenderness. No bleeding around the vagina. Vaginal discharge (small amount of thin, white, malodorous discharge noted) found.  Neurological: She is alert and oriented to person, place, and time.  Skin: Skin is warm and dry. No erythema.  Psychiatric: She has a normal mood and affect.   Results for orders placed during the hospital encounter of 01/04/13 (from the past 24 hour(s))  URINALYSIS, ROUTINE W REFLEX MICROSCOPIC     Status: Abnormal   Collection Time    01/04/13  7:26 PM      Result Value Range   Color, Urine YELLOW  YELLOW   APPearance CLEAR  CLEAR   Specific Gravity, Urine >1.030 (*) 1.005 - 1.030   pH 6.0  5.0 - 8.0   Glucose, UA NEGATIVE  NEGATIVE mg/dL   Hgb urine dipstick NEGATIVE  NEGATIVE   Bilirubin Urine NEGATIVE  NEGATIVE   Ketones, ur 15 (*) NEGATIVE mg/dL   Protein, ur NEGATIVE  NEGATIVE mg/dL   Urobilinogen, UA 0.2  0.0 - 1.0 mg/dL   Nitrite NEGATIVE  NEGATIVE   Leukocytes, UA NEGATIVE  NEGATIVE  POCT PREGNANCY, URINE     Status: Abnormal   Collection Time    01/04/13  7:37 PM      Result Value Range   Preg Test, Ur POSITIVE (*) NEGATIVE  WET PREP, GENITAL     Status: Abnormal   Collection Time    01/04/13  8:08 PM      Result Value Range   Yeast Wet Prep HPF POC NONE SEEN  NONE SEEN   Trich, Wet Prep NONE SEEN  NONE SEEN   Clue Cells Wet Prep HPF POC FEW (*) NONE SEEN   WBC, Wet Prep HPF POC FEW (*) NONE SEEN  CBC     Status: Abnormal   Collection Time    01/04/13   8:18 PM      Result Value Range   WBC 7.1  4.0 - 10.5 K/uL   RBC 3.60 (*) 3.87 - 5.11 MIL/uL   Hemoglobin 10.8 (*) 12.0 - 15.0 g/dL   HCT 16.1 (*) 09.6 - 04.5 %   MCV 86.4  78.0 - 100.0 fL   MCH 30.0  26.0 - 34.0 pg   MCHC 34.7  30.0 - 36.0 g/dL   RDW 40.9  81.1 - 91.4 %   Platelets 315  150 - 400 K/uL  HCG, QUANTITATIVE, PREGNANCY     Status: Abnormal   Collection Time    01/04/13  8:18 PM      Result Value Range   hCG, Beta Chain, Quant, S 10569 (*) <5 mIU/mL   US Ob Comp Less 14 Wks  01/04/2013   CLINICAL DATA:  Bleeding, pain, pregnant  EXAM: OBSTETRIC <14 WK ULTRASOUND  TECHNIQUE: Transabdominal ultrasound was performed for evaluation of the gestation as well as the maternal uterus and adnexal regions.  COMPARISON:  09/12/2012  FINDINGS: Intrauterine gestational sac: Single  Yolk sac:  Present  Embryo:  Not seen  Cardiac Activity: Not seen  MSD: 1.19 cm 6 w   0  d  [place] EDC: 08/30/2013  Maternal uterus/adnexae: No free fluid. No subchorionic hemorrhage evident. Ovaries unremarkable.  IMPRESSION: 1. Probable early intrauterine gestational sac, but no fetal pole, or cardiac activity yet visualized. Recommend follow-up quantitative B-HCG levels and follow-up US in 14 days to confirm and assess viability. This recommendation follows SRU consensus guidelines: Diagnostic Criteria for Nonviable Pregnancy Early in the First Trimester. Malva Limes Med 2013; 782:9562-13.   Electronically Signed   By: Oley Balm M.D.   On: 01/04/2013 20:56      MAU Course  Procedures None  MDM +UPT Wet prep, GC/Chlamydia, CBC, quant hCG and [place] today  Assessment and Plan  A: IUGS and YS at [redacted]w[redacted]d Bacterial vaginosis Vaginal bleeding in pregnancy, first trimester  P: Discharge home Rx for Flagyl sent to patient's pharmacy Patient given letter of confirmation. Patient plans to go to Dr. Gaynell Face for prenatal care Bleeding precautions discussed Patient may return to MAU as needed or if her condition  were to change or worsen  Freddi Starr, PA-C  01/04/2013, 9:40 PM

## 2013-01-04 NOTE — MAU Note (Signed)
Patient presents to MAu with c/o vaginal discharge with odor. States also desire pregnancy test. Denies any pain nor bleeding.

## 2013-02-16 ENCOUNTER — Encounter (HOSPITAL_COMMUNITY): Payer: Self-pay

## 2013-02-16 ENCOUNTER — Inpatient Hospital Stay (HOSPITAL_COMMUNITY)
Admission: AD | Admit: 2013-02-16 | Discharge: 2013-02-16 | Disposition: A | Discharge status: Home / Self Care | Payer: Medicaid Other | Source: Ambulatory Visit | Attending: Obstetrics & Gynecology | Admitting: Obstetrics & Gynecology

## 2013-02-16 DIAGNOSIS — M545 Low back pain, unspecified: Secondary | ICD-10-CM | POA: Insufficient documentation

## 2013-02-16 DIAGNOSIS — Z87891 Personal history of nicotine dependence: Secondary | ICD-10-CM | POA: Insufficient documentation

## 2013-02-16 DIAGNOSIS — R030 Elevated blood-pressure reading, without diagnosis of hypertension: Secondary | ICD-10-CM | POA: Insufficient documentation

## 2013-02-16 DIAGNOSIS — Z3490 Encounter for supervision of normal pregnancy, unspecified, unspecified trimester: Secondary | ICD-10-CM

## 2013-02-16 DIAGNOSIS — B373 Candidiasis of vulva and vagina: Secondary | ICD-10-CM | POA: Insufficient documentation

## 2013-02-16 DIAGNOSIS — O99891 Other specified diseases and conditions complicating pregnancy: Secondary | ICD-10-CM | POA: Insufficient documentation

## 2013-02-16 DIAGNOSIS — B3731 Acute candidiasis of vulva and vagina: Secondary | ICD-10-CM | POA: Insufficient documentation

## 2013-02-16 DIAGNOSIS — Y92009 Unspecified place in unspecified non-institutional (private) residence as the place of occurrence of the external cause: Secondary | ICD-10-CM | POA: Insufficient documentation

## 2013-02-16 DIAGNOSIS — W19XXXA Unspecified fall, initial encounter: Secondary | ICD-10-CM | POA: Insufficient documentation

## 2013-02-16 DIAGNOSIS — N898 Other specified noninflammatory disorders of vagina: Secondary | ICD-10-CM

## 2013-02-16 DIAGNOSIS — O2390 Unspecified genitourinary tract infection in pregnancy, unspecified trimester: Secondary | ICD-10-CM | POA: Insufficient documentation

## 2013-02-16 NOTE — MAU Provider Note (Signed)
[name] [name] is a 44 y.I.[id number] @[redacted]w[redacted]d  by 6wk scan.    Chief Complaint  Patient presents with  . Fall     SUBJECTIVE  HPI:  Pt fell on Tuesday from standing and landed on her buttock. Came in today to see if baby was ok.  Since the fall has had some low back pain but no abdominal pain.  Pain has almost completely gone away today.  Has never heard the heartbeat of her baby so wanted to make sure things are ok.   Has an appt on 02/22/13 with Lendell Caprice for a new obv.    No fevers, chills, nausea, vomiting, vaginal bleeding. Was treated for BV and now knows she has a yeast infection and wants to make sure she can use monistat.      History reviewed. No pertinent past medical history. Past Surgical History  Procedure Laterality Date  . Cesarean section    . Dilation and evacuation N/A 09/12/2012    Procedure: DILATATION AND EVACUATION;  Surgeon: Adam Phenix, MD;  Location: [facility];  Service: Gynecology;  Laterality: N/A;   History   Social History  . Marital Status: Single    Spouse Name: N/A    Number of Children: N/A  . Years of Education: N/A   Occupational History  . Not on file.   Social History Main Topics  . Smoking status: Former Smoker    Types: Cigarettes  . Smokeless tobacco: Not on file  . Alcohol Use: No  . Drug Use: Yes    Special: Marijuana     Comment: last used in February  . Sexual Activity: Yes    Birth Control/ Protection: None   Other Topics Concern  . Not on file   Social History Narrative  . No narrative on file   No current facility-administered medications on file prior to encounter.   No current outpatient prescriptions on file prior to encounter.   No Known Allergies  ROS: Pertinent items in HPI  OBJECTIVE Blood pressure 155/69, pulse 96, temperature 98.7 F (37.1 C), resp. rate 18, height 5\' 4"  (1.626 m), weight 102.967 kg (227 lb), last menstrual period 11/28/2012.  GENERAL: Well-developed, well-nourished female in no  acute distress.  HEENT: Normocephalic, good dentition HEART: normal rate, normal rhythm RESP: normal effort ABDOMEN: Soft, nontender Back: nontender EXTREMITIES: Nontender, no edema NEURO: Alert and oriented SPECULUM EXAM: declined by pt.   Fetal doppler: 160  LAB RESULTS  No results found for this or any previous visit (from the past 24 hour(s)).  IMAGING   ASSESSMENT  1. Fall at home, initial encounter   2. Pregnancy at early stage   3. Elevated blood pressure reading without diagnosis of hypertension   4. Vaginal discharge in pregnancy, first trimester     PLAN  1) Fall - currently asymptomatic  - no concerns  - reassurance given  2) early pregnancy - has new ob appt next week - doppler heart tones normal today - reassurance given - discussed PNV and pt to take over the counter  3) Yeast infection - ok to use otc monistat - pt declined testing today - advised to return or discuss with doctor if no improvement  4) elevated BP - appears to be chronic HTN - advised to talk to Dr. Lendell Caprice at appt about this.   5) d/c to home     Medication List    Notice   You have not been prescribed any medications.  No Follow-up on file.     Vale Haven MD OB Fellow 02/16/2013 8:43 AM

## 2013-02-16 NOTE — MAU Note (Signed)
Pt presents with complaints of falling on her buttocks on Tuesday. She states that she is sore in her lower abdomen. Denies any bleeding.

## 2013-04-25 ENCOUNTER — Ambulatory Visit (INDEPENDENT_AMBULATORY_CARE_PROVIDER_SITE_OTHER): Payer: Medicaid Other | Admitting: Advanced Practice Midwife

## 2013-04-25 ENCOUNTER — Encounter: Payer: Self-pay | Admitting: Advanced Practice Midwife

## 2013-04-25 ENCOUNTER — Other Ambulatory Visit (HOSPITAL_COMMUNITY)
Admission: RE | Admit: 2013-04-25 | Discharge: 2013-04-25 | Disposition: A | Discharge status: Home / Self Care | Payer: Medicaid Other | Source: Ambulatory Visit | Attending: Obstetrics and Gynecology | Admitting: Obstetrics and Gynecology

## 2013-04-25 VITALS — BP 85/52 | Temp 98.9°F | Wt 230.5 lb

## 2013-04-25 DIAGNOSIS — M549 Dorsalgia, unspecified: Secondary | ICD-10-CM

## 2013-04-25 DIAGNOSIS — Z01419 Encounter for gynecological examination (general) (routine) without abnormal findings: Secondary | ICD-10-CM | POA: Insufficient documentation

## 2013-04-25 DIAGNOSIS — O99320 Drug use complicating pregnancy, unspecified trimester: Secondary | ICD-10-CM

## 2013-04-25 DIAGNOSIS — J069 Acute upper respiratory infection, unspecified: Secondary | ICD-10-CM

## 2013-04-25 DIAGNOSIS — F1920 Other psychoactive substance dependence, uncomplicated: Secondary | ICD-10-CM

## 2013-04-25 DIAGNOSIS — Z3490 Encounter for supervision of normal pregnancy, unspecified, unspecified trimester: Secondary | ICD-10-CM

## 2013-04-25 DIAGNOSIS — Z1151 Encounter for screening for human papillomavirus (HPV): Secondary | ICD-10-CM | POA: Insufficient documentation

## 2013-04-25 DIAGNOSIS — O9989 Other specified diseases and conditions complicating pregnancy, childbirth and the puerperium: Secondary | ICD-10-CM

## 2013-04-25 DIAGNOSIS — Z113 Encounter for screening for infections with a predominantly sexual mode of transmission: Secondary | ICD-10-CM | POA: Insufficient documentation

## 2013-04-25 DIAGNOSIS — O99891 Other specified diseases and conditions complicating pregnancy: Secondary | ICD-10-CM

## 2013-04-25 DIAGNOSIS — Z3480 Encounter for supervision of other normal pregnancy, unspecified trimester: Secondary | ICD-10-CM | POA: Insufficient documentation

## 2013-04-25 LAB — POCT URINALYSIS DIP (DEVICE)
Bilirubin Urine: NEGATIVE
Glucose, UA: NEGATIVE mg/dL
Hgb urine dipstick: NEGATIVE
Ketones, ur: NEGATIVE mg/dL
LEUKOCYTES UA: NEGATIVE
NITRITE: NEGATIVE
PH: 7 (ref 5.0–8.0)
PROTEIN: 30 mg/dL — AB
Specific Gravity, Urine: >=1.03 (ref 1.005–1.030)
UROBILINOGEN UA: 0.2 mg/dL (ref 0.0–1.0)

## 2013-04-25 MED ORDER — SALINE SPRAY 0.65 % NA SOLN: 1.0000 | NASAL | Status: DC | PRN

## 2013-04-25 MED ORDER — BENZONATATE 100 MG PO CAPS: 200.0000 mg | ORAL_CAPSULE | Freq: Three times a day (TID) | ORAL | Status: DC | PRN

## 2013-04-25 MED ORDER — GUAIFENESIN ER 600 MG PO TB12: 1200.0000 mg | ORAL_TABLET | Freq: Two times a day (BID) | ORAL | Status: DC

## 2013-04-25 NOTE — Progress Notes (Signed)
   Subjective:    [name] [name] is a [id number] being seen today for her first obstetrical visit.  Her obstetrical history is significant for SAB x1, TAB x1, LTCS at term x2. Patient does intend to breast feed. Pregnancy history fully reviewed.  Patient reports back pain, and recent URI with cough, chest congestion without fever/chills.Ceasar Mons.  Filed Vitals:   04/25/13 1434  BP: 85/52  Temp: 98.9 F (37.2 C)  Weight: 230 lb 8 oz (104.554 kg)    HISTORY: OB History  Gravida Para Term Preterm AB SAB TAB Ectopic Multiple Living  5 2   2 1 1   2     # Outcome Date GA Lbr Len/2nd Weight Sex Delivery Anes PTL Lv  5 CUR           4 SAB 2014          3 TAB           2 PAR     F LTCS   Y  1 PAR     M LTCS   Y     History reviewed. No pertinent past medical history. Past Surgical History  Procedure Laterality Date  . Cesarean section    . Dilation and evacuation N/A 09/12/2012    Procedure: DILATATION AND EVACUATION;  Surgeon: Adam PhenixJames G Arnold, MD;  Location: [facility];  Service: Gynecology;  Laterality: N/A;   Family History  Problem Relation Age of Onset  . Diabetes Mother   . Hypertension Mother   . Hypertension Father      Exam    Uterus:     Pelvic Exam:    Perineum: No Hemorrhoids, Normal Perineum   Vulva: normal   Vagina:  normal mucosa, normal discharge   pH:    Cervix: no bleeding following Pap, no cervical motion tenderness and no lesions   Adnexa: normal adnexa   Bony Pelvis: average  System: Breast:  normal appearance, no masses or tenderness   Skin: normal coloration and turgor, no rashes    Neurologic: oriented, normal, gait normal; reflexes normal and symmetric   Extremities: normal strength, tone, and muscle mass   HEENT neck supple with midline trachea   Mouth/Teeth mucous membranes moist, pharynx normal without lesions and dental hygiene good   Neck supple and no masses   Cardiovascular: regular rate and rhythm   Respiratory:  appears well, vitals  normal, no respiratory distress, acyanotic, normal RR, ear and throat exam is normal, neck free of mass or lymphadenopathy, chest clear, no wheezing, crepitations, rhonchi, normal symmetric air entry   Abdomen: soft, non-tender; bowel sounds normal; no masses,  no organomegaly   Urinary: urethral meatus normal      Assessment:    Pregnancy: [id number] Patient Active Problem List   Diagnosis Date Noted  . Supervision of normal subsequent pregnancy 04/25/2013  . Incomplete spontaneous abortion without mention of complication 09/13/2012        Plan:     Initial labs drawn. Prenatal vitamins. Tessalon Perles and Mucinex Increase PO fluids Recommend abdominal support belt for back pain Problem list reviewed and updated. Genetic Screening discussed Quad Screen: ordered.  Ultrasound discussed; fetal survey: ordered.  Follow up in 4 weeks. 50% of 30 min visit spent on counseling and coordination of care.     [name], [name] Smyre 04/25/2013

## 2013-04-25 NOTE — Progress Notes (Signed)
P=110  Initial prenatal visit

## 2013-04-26 LAB — OBSTETRIC PANEL
Antibody Screen: NEGATIVE
BASOS PCT: 0 % (ref 0–1)
Basophils Absolute: 0 10*3/uL (ref 0.0–0.1)
Eosinophils Absolute: 0 10*3/uL (ref 0.0–0.7)
Eosinophils Relative: 0 % (ref 0–5)
HEMATOCRIT: 31.6 % — AB (ref 36.0–46.0)
Hemoglobin: 10.6 g/dL — ABNORMAL LOW (ref 12.0–15.0)
Hepatitis B Surface Ag: NEGATIVE
LYMPHS PCT: 20 % (ref 12–46)
Lymphs Abs: 0.8 10*3/uL (ref 0.7–4.0)
MCH: 30.4 pg (ref 26.0–34.0)
MCHC: 33.5 g/dL (ref 30.0–36.0)
MCV: 90.5 fL (ref 78.0–100.0)
MONO ABS: 0.5 10*3/uL (ref 0.1–1.0)
MONOS PCT: 14 % — AB (ref 3–12)
Neutro Abs: 2.5 10*3/uL (ref 1.7–7.7)
Neutrophils Relative %: 66 % (ref 43–77)
Platelets: 283 10*3/uL (ref 150–400)
RBC: 3.49 MIL/uL — ABNORMAL LOW (ref 3.87–5.11)
RDW: 13.6 % (ref 11.5–15.5)
RH TYPE: POSITIVE
Rubella: 2.74 Index — ABNORMAL HIGH (ref <0.90)
WBC: 3.8 10*3/uL — ABNORMAL LOW (ref 4.0–10.5)

## 2013-04-26 LAB — AFP, QUAD SCREEN
AFP: 39.6 IU/mL
Age Alone: 1:610 {titer}
Curr Gest Age: 21.6 wks.days
Down Syndrome Scr Risk Est: 1:5600 {titer}
HCG TOTAL: 12872 m[IU]/mL
INH: 82.2 pg/mL
INTERPRETATION-AFP: NEGATIVE
MoM for AFP: 0.77
MoM for INH: 0.5
MoM for hCG: 1.36
Open Spina bifida: NEGATIVE
Osb Risk: <1:54600 {titer}
Tri 18 Scr Risk Est: NEGATIVE
Trisomy 18 (Edward) Syndrome Interp.: 1:23400 {titer}
UE3 MOM: 0.76
UE3 VALUE: 1.1 ng/mL

## 2013-04-26 LAB — GLUCOSE TOLERANCE, 1 HOUR (50G) W/O FASTING: GLUCOSE 1 HOUR GTT: 106 mg/dL (ref 70–140)

## 2013-04-26 LAB — HIV ANTIBODY (ROUTINE TESTING W REFLEX): HIV: NONREACTIVE

## 2013-04-27 LAB — HEMOGLOBINOPATHY EVALUATION
Hemoglobin Other: 0 %
Hgb A2 Quant: 3 % (ref 2.2–3.2)
Hgb A: 97 % (ref 96.8–97.8)
Hgb F Quant: 0 % (ref 0.0–2.0)
Hgb S Quant: 0 %

## 2013-04-30 ENCOUNTER — Encounter: Payer: Self-pay | Admitting: Advanced Practice Midwife

## 2013-04-30 DIAGNOSIS — O99322 Drug use complicating pregnancy, second trimester: Secondary | ICD-10-CM | POA: Insufficient documentation

## 2013-04-30 LAB — PRESCRIPTION MONITORING PROFILE (19 PANEL)
Amphetamine/Meth: NEGATIVE ng/mL
BUPRENORPHINE, URINE: NEGATIVE ng/mL
Barbiturate Screen, Urine: NEGATIVE ng/mL
Benzodiazepine Screen, Urine: NEGATIVE ng/mL
CARISOPRODOL, URINE: NEGATIVE ng/mL
COCAINE METABOLITES: NEGATIVE ng/mL
CREATININE, URINE: 473.25 mg/dL (ref >20.0)
FENTANYL URINE: NEGATIVE ng/mL
MDMA URINE: NEGATIVE ng/mL
METHADONE SCREEN, URINE: NEGATIVE ng/mL
Meperidine, Ur: NEGATIVE ng/mL
Methaqualone: NEGATIVE ng/mL
Nitrites, Initial: NEGATIVE ug/mL
OXYCODONE SCRN UR: NEGATIVE ng/mL
Opiate Screen, Urine: NEGATIVE ng/mL
PHENCYCLIDINE, UR: NEGATIVE ng/mL
Propoxyphene: NEGATIVE ng/mL
Tapentadol, urine: NEGATIVE ng/mL
Tramadol Scrn, Ur: NEGATIVE ng/mL
Zolpidem, Urine: NEGATIVE ng/mL
pH, Initial: 8 pH (ref 4.5–8.9)

## 2013-04-30 LAB — CANNABANOIDS (GC/LC/MS), URINE: THC-COOH (GC/LC/MS), ur confirm: 168 ng/mL — AB

## 2013-05-01 LAB — CULTURE, OB URINE: Colony Count: 60000

## 2013-05-03 ENCOUNTER — Ambulatory Visit (HOSPITAL_COMMUNITY)
Admission: RE | Admit: 2013-05-03 | Discharge: 2013-05-03 | Disposition: A | Discharge status: Home / Self Care | Payer: Medicaid Other | Source: Ambulatory Visit | Attending: Advanced Practice Midwife | Admitting: Advanced Practice Midwife

## 2013-05-03 ENCOUNTER — Other Ambulatory Visit: Payer: Self-pay | Admitting: Advanced Practice Midwife

## 2013-05-03 DIAGNOSIS — Z3490 Encounter for supervision of normal pregnancy, unspecified, unspecified trimester: Secondary | ICD-10-CM

## 2013-05-03 DIAGNOSIS — Z3689 Encounter for other specified antenatal screening: Secondary | ICD-10-CM | POA: Insufficient documentation

## 2013-05-03 DIAGNOSIS — Z3480 Encounter for supervision of other normal pregnancy, unspecified trimester: Secondary | ICD-10-CM

## 2013-05-28 ENCOUNTER — Ambulatory Visit (INDEPENDENT_AMBULATORY_CARE_PROVIDER_SITE_OTHER): Payer: Medicaid Other | Admitting: Obstetrics & Gynecology

## 2013-05-28 ENCOUNTER — Encounter: Payer: Self-pay | Admitting: Obstetrics & Gynecology

## 2013-05-28 VITALS — BP 123/70 | Wt 235.9 lb

## 2013-05-28 DIAGNOSIS — Z23 Encounter for immunization: Secondary | ICD-10-CM

## 2013-05-28 DIAGNOSIS — Z3480 Encounter for supervision of other normal pregnancy, unspecified trimester: Secondary | ICD-10-CM

## 2013-05-28 LAB — POCT URINALYSIS DIP (DEVICE)
Bilirubin Urine: NEGATIVE
Glucose, UA: NEGATIVE mg/dL
Hgb urine dipstick: NEGATIVE
Ketones, ur: NEGATIVE mg/dL
Leukocytes, UA: NEGATIVE
NITRITE: NEGATIVE
PH: 7 (ref 5.0–8.0)
Protein, ur: 30 mg/dL — AB
Specific Gravity, Urine: 1.025 (ref 1.005–1.030)
UROBILINOGEN UA: 1 mg/dL (ref 0.0–1.0)

## 2013-05-28 MED ORDER — PRENATAL 27-0.8 MG PO TABS
1.0000 | ORAL_TABLET | Freq: Every day | ORAL | Status: DC
Start: 2013-05-28 — End: 2013-06-17

## 2013-05-28 MED ORDER — TETANUS-DIPHTH-ACELL PERTUSSIS 5-2.5-18.5 LF-MCG/0.5 IM SUSP: 0.5000 mL | Freq: Once | INTRAMUSCULAR | Status: DC

## 2013-05-28 NOTE — Patient Instructions (Signed)
Glucose Tolerance Test During Pregnancy  The glucose tolerance test (GTT) or 3-hour glucose test can be used to determine if a woman has diabetes that first begins or is first recognized during pregnancy (gestational diabetes). Typically, a GTT is done after you have had a 1-hour glucose test with results that indicate you possibly have gestational diabetes.   The test takes about 3 hours. There will be a series of blood tests after you drink the sugar water solution. You must remain at the testing location to make sure that your blood is drawn on time.   LET YOUR CAREGIVER KNOW ABOUT:  · Allergies to food or medicine.  · Medicines taken, including vitamins, herbs, eyedrops, over-the-counter medicines, and creams.  · Any recent illnesses or infections.  BEFORE THE PROCEDURE  The GTT is a fasting test, meaning you must stop eating for a certain amount of time. The test will be the most accurate if you have not eaten for 8 12 hours before the test. For this reason, it is recommended that you have this test done in the morning before you have breakfast.  PROCEDURE   Do not eat or drink anything but water during the test. When you arrive at the lab, a sample of your blood is taken to get your fasting blood glucose level. After your fasting glucose level is determined, you will be given a sugar water solution to drink. You will be asked to wait in a certain area until your next blood test. The blood tests are done each hour for 3 hours. Stay close to the lab so your blood samples can be taken on time. This is important. If the blood samples are not taken on time, the test will need to be done again on another day.   AFTER THE PROCEDURE  · You can eat and drink as usual.    · Ask when your test results will be ready. Make sure you get your test results. A positive test is considered when two of the four blood test values are equal or above the normal blood glucose level.  Document Released: 08/24/2011 Document Reviewed:  08/24/2011  [employer]® Patient Information ©2014 [employer].

## 2013-05-28 NOTE — Progress Notes (Signed)
P: 94 Pt will do 28 week labs at next visit. She had early glucola at last visit.

## 2013-05-28 NOTE — Progress Notes (Signed)
Pt with no problems Flu and Tdap today Title XIX forned signed today

## 2013-06-06 ENCOUNTER — Encounter: Payer: Self-pay | Admitting: *Deleted

## 2013-06-09 ENCOUNTER — Encounter (HOSPITAL_COMMUNITY): Payer: Self-pay | Admitting: *Deleted

## 2013-06-17 ENCOUNTER — Inpatient Hospital Stay (HOSPITAL_COMMUNITY)
Admission: AD | Admit: 2013-06-17 | Discharge: 2013-06-17 | Disposition: A | Discharge status: Home / Self Care | Payer: Medicaid Other | Source: Ambulatory Visit | Attending: Obstetrics & Gynecology | Admitting: Obstetrics & Gynecology

## 2013-06-17 ENCOUNTER — Encounter (HOSPITAL_COMMUNITY): Payer: Self-pay | Admitting: *Deleted

## 2013-06-17 DIAGNOSIS — Z87891 Personal history of nicotine dependence: Secondary | ICD-10-CM | POA: Insufficient documentation

## 2013-06-17 DIAGNOSIS — O368190 Decreased fetal movements, unspecified trimester, not applicable or unspecified: Secondary | ICD-10-CM | POA: Insufficient documentation

## 2013-06-17 HISTORY — DX: Other specified health status: Z78.9

## 2013-06-17 NOTE — MAU Note (Signed)
Pt presents with complaints of a decrease in fetal movement since yesterday. Denies any vaginal bleeding or LOF

## 2013-06-17 NOTE — MAU Provider Note (Signed)
History   [id number] @ 28.6 wks in with c/o not feeling baby move since last night.  CSN: [id number]  Arrival date and time: 06/17/13 [id number]   First Provider Initiated Contact with Patient 06/17/13 [phone]      Chief Complaint  Patient presents with  . Decreased Fetal Movement   HPI  OB History   Grav Para Term Preterm Abortions TAB SAB Ect Mult Living   6 2 1 1 3 1 2  0 0 2      Past Medical History  Diagnosis Date  . Medical history non-contributory     Past Surgical History  Procedure Laterality Date  . Cesarean section    . Dilation and evacuation N/A 09/12/2012    Procedure: DILATATION AND EVACUATION;  Surgeon: Adam PhenixJames G Arnold, MD;  Location: [facility];  Service: Gynecology;  Laterality: N/A;    Family History  Problem Relation Age of Onset  . Diabetes Mother   . Hypertension Mother   . Hypertension Father     History  Substance Use Topics  . Smoking status: Former Smoker    Types: Cigarettes  . Smokeless tobacco: Never Used  . Alcohol Use: No    Allergies: No Known Allergies  Facility-administered medications prior to admission  Medication Dose Route Frequency Provider Last Rate Last Dose  . Tdap (BOOSTRIX) injection 0.5 mL  0.5 mL Intramuscular Once Willodean Rosenthalarolyn Harraway-Smith, MD       Prescriptions prior to admission  Medication Sig Dispense Refill  . benzonatate (TESSALON PERLES) 100 MG capsule Take 2 capsules (200 mg total) by mouth 3 (three) times daily as needed for cough.  20 capsule  0  . guaiFENesin (MUCINEX) 600 MG 12 hr tablet Take 2 tablets (1,200 mg total) by mouth 2 (two) times daily.  20 tablet  0  . Prenatal Vit-Fe Fumarate-FA (MULTIVITAMIN-PRENATAL) 27-0.8 MG TABS tablet Take 1 tablet by mouth daily at 12 noon.  30 each  12  . sodium chloride (OCEAN) 0.65 % SOLN nasal spray Place 1 spray into both nostrils as needed for congestion.  1 Bottle  0    Review of Systems  Constitutional: Negative.   HENT: Negative.   Eyes: Negative.   Respiratory:  Negative.   Cardiovascular: Negative.   Gastrointestinal: Negative.   Genitourinary: Negative.   Musculoskeletal: Negative.   Skin: Negative.   Endo/Heme/Allergies: Negative.   Psychiatric/Behavioral: Negative.    Physical Exam   Blood pressure 146/66, pulse 88, temperature 98 F (36.7 C), resp. rate 18, height 5\' 2"  (1.575 m), weight 235 lb (106.595 kg), last menstrual period 11/28/2012, unknown if currently breastfeeding.  Physical Exam  Constitutional: She is oriented to person, place, and time. She appears well-developed and well-nourished.  HENT:  Head: Normocephalic.  Eyes: Pupils are equal, round, and reactive to light.  Neck: Normal range of motion.  Cardiovascular: Normal rate, regular rhythm, normal heart sounds and intact distal pulses.   Respiratory: Effort normal and breath sounds normal.  GI: Soft. Bowel sounds are normal.  Genitourinary: Vagina normal and uterus normal.  Musculoskeletal: Normal range of motion.  Neurological: She is alert and oriented to person, place, and time. She has normal reflexes.  Skin: Skin is warm and dry.  Psychiatric: She has a normal mood and affect. Her behavior is normal. Judgment and thought content normal.    MAU Course  Procedures  MDM D/c home with reactive NST  Assessment and Plan  Reactive FHR pattrn, d/c home.  Ferdie PingMarie Darlene Lawson 06/17/2013, 9:43 AM

## 2013-06-17 NOTE — Discharge Instructions (Signed)
Fetal Movement Counts Patient Name: __________________________________________________ Patient Due Date: ____________________ Performing a fetal movement count is highly recommended in high-risk pregnancies, but it is good for every pregnant woman to do. Your caregiver may ask you to start counting fetal movements at 28 weeks of the pregnancy. Fetal movements often increase:  After eating a full meal.  After physical activity.  After eating or drinking something sweet or cold.  At rest. Pay attention to when you feel the baby is most active. This will help you notice a pattern of your baby's sleep and wake cycles and what factors contribute to an increase in fetal movement. It is important to perform a fetal movement count at the same time each day when your baby is normally most active.  HOW TO COUNT FETAL MOVEMENTS 1. Find a quiet and comfortable area to sit or lie down on your left side. Lying on your left side provides the best blood and oxygen circulation to your baby. 2. Write down the day and time on a sheet of paper or in a journal. 3. Start counting kicks, flutters, swishes, rolls, or jabs in a 2 hour period. You should feel at least 10 movements within 2 hours. 4. If you do not feel 10 movements in 2 hours, wait 2 3 hours and count again. Look for a change in the pattern or not enough counts in 2 hours. SEEK MEDICAL CARE IF:  You feel less than 10 counts in 2 hours, tried twice.  There is no movement in over an hour.  The pattern is changing or taking longer each day to reach 10 counts in 2 hours.  You feel the baby is not moving as he or she usually does. Date: ____________ Movements: ____________ Start time: ____________ Finish time: ____________  Date: ____________ Movements: ____________ Start time: ____________ Finish time: ____________ Date: ____________ Movements: ____________ Start time: ____________ Finish time: ____________ Date: ____________ Movements: ____________  Start time: ____________ Finish time: ____________ Date: ____________ Movements: ____________ Start time: ____________ Finish time: ____________ Date: ____________ Movements: ____________ Start time: ____________ Finish time: ____________ Date: ____________ Movements: ____________ Start time: ____________ Finish time: ____________ Date: ____________ Movements: ____________ Start time: ____________ Finish time: ____________  Date: ____________ Movements: ____________ Start time: ____________ Finish time: ____________ Date: ____________ Movements: ____________ Start time: ____________ Finish time: ____________ Date: ____________ Movements: ____________ Start time: ____________ Finish time: ____________ Date: ____________ Movements: ____________ Start time: ____________ Finish time: ____________ Date: ____________ Movements: ____________ Start time: ____________ Finish time: ____________ Date: ____________ Movements: ____________ Start time: ____________ Finish time: ____________ Date: ____________ Movements: ____________ Start time: ____________ Finish time: ____________  Date: ____________ Movements: ____________ Start time: ____________ Finish time: ____________ Date: ____________ Movements: ____________ Start time: ____________ Finish time: ____________ Date: ____________ Movements: ____________ Start time: ____________ Finish time: ____________ Date: ____________ Movements: ____________ Start time: ____________ Finish time: ____________ Date: ____________ Movements: ____________ Start time: ____________ Finish time: ____________ Date: ____________ Movements: ____________ Start time: ____________ Finish time: ____________ Date: ____________ Movements: ____________ Start time: ____________ Finish time: ____________  Date: ____________ Movements: ____________ Start time: ____________ Finish time: ____________ Date: ____________ Movements: ____________ Start time: ____________ Finish time:  ____________ Date: ____________ Movements: ____________ Start time: ____________ Finish time: ____________ Date: ____________ Movements: ____________ Start time: ____________ Finish time: ____________ Date: ____________ Movements: ____________ Start time: ____________ Finish time: ____________ Date: ____________ Movements: ____________ Start time: ____________ Finish time: ____________ Date: ____________ Movements: ____________ Start time: ____________ Finish time: ____________  Date: ____________ Movements: ____________ Start time: ____________ Finish   time: ____________ Date: ____________ Movements: ____________ Start time: ____________ Finish time: ____________ Date: ____________ Movements: ____________ Start time: ____________ Finish time: ____________ Date: ____________ Movements: ____________ Start time: ____________ Finish time: ____________ Date: ____________ Movements: ____________ Start time: ____________ Finish time: ____________ Date: ____________ Movements: ____________ Start time: ____________ Finish time: ____________ Date: ____________ Movements: ____________ Start time: ____________ Finish time: ____________  Date: ____________ Movements: ____________ Start time: ____________ Finish time: ____________ Date: ____________ Movements: ____________ Start time: ____________ Finish time: ____________ Date: ____________ Movements: ____________ Start time: ____________ Finish time: ____________ Date: ____________ Movements: ____________ Start time: ____________ Finish time: ____________ Date: ____________ Movements: ____________ Start time: ____________ Finish time: ____________ Date: ____________ Movements: ____________ Start time: ____________ Finish time: ____________ Date: ____________ Movements: ____________ Start time: ____________ Finish time: ____________  Date: ____________ Movements: ____________ Start time: ____________ Finish time: ____________ Date: ____________ Movements:  ____________ Start time: ____________ Finish time: ____________ Date: ____________ Movements: ____________ Start time: ____________ Finish time: ____________ Date: ____________ Movements: ____________ Start time: ____________ Finish time: ____________ Date: ____________ Movements: ____________ Start time: ____________ Finish time: ____________ Date: ____________ Movements: ____________ Start time: ____________ Finish time: ____________ Date: ____________ Movements: ____________ Start time: ____________ Finish time: ____________  Date: ____________ Movements: ____________ Start time: ____________ Finish time: ____________ Date: ____________ Movements: ____________ Start time: ____________ Finish time: ____________ Date: ____________ Movements: ____________ Start time: ____________ Finish time: ____________ Date: ____________ Movements: ____________ Start time: ____________ Finish time: ____________ Date: ____________ Movements: ____________ Start time: ____________ Finish time: ____________ Date: ____________ Movements: ____________ Start time: ____________ Finish time: ____________ Document Released: 03/24/2006 Document Revised: 02/09/2012 Document Reviewed: 12/20/2011 Exit[employer] Patient Information 2014 [employer].  

## 2013-06-26 ENCOUNTER — Ambulatory Visit (INDEPENDENT_AMBULATORY_CARE_PROVIDER_SITE_OTHER): Payer: Medicaid Other | Admitting: Advanced Practice Midwife

## 2013-06-26 VITALS — BP 106/68 | HR 87 | Temp 97.6°F | Wt 240.4 lb

## 2013-06-26 DIAGNOSIS — F1910 Other psychoactive substance abuse, uncomplicated: Secondary | ICD-10-CM

## 2013-06-26 DIAGNOSIS — O99340 Other mental disorders complicating pregnancy, unspecified trimester: Secondary | ICD-10-CM

## 2013-06-26 DIAGNOSIS — O99322 Drug use complicating pregnancy, second trimester: Secondary | ICD-10-CM

## 2013-06-26 DIAGNOSIS — Z3480 Encounter for supervision of other normal pregnancy, unspecified trimester: Secondary | ICD-10-CM

## 2013-06-26 DIAGNOSIS — O034 Incomplete spontaneous abortion without complication: Secondary | ICD-10-CM

## 2013-06-26 LAB — POCT URINALYSIS DIP (DEVICE)
Bilirubin Urine: NEGATIVE
Glucose, UA: NEGATIVE mg/dL
HGB URINE DIPSTICK: NEGATIVE
Ketones, ur: NEGATIVE mg/dL
Leukocytes, UA: NEGATIVE
Nitrite: NEGATIVE
PH: 7 (ref 5.0–8.0)
Protein, ur: 30 mg/dL — AB
Specific Gravity, Urine: 1.02 (ref 1.005–1.030)
Urobilinogen, UA: 0.2 mg/dL (ref 0.0–1.0)

## 2013-06-26 LAB — CBC
HEMATOCRIT: 30.4 % — AB (ref 36.0–46.0)
Hemoglobin: 10.4 g/dL — ABNORMAL LOW (ref 12.0–15.0)
MCH: 29.5 pg (ref 26.0–34.0)
MCHC: 34.2 g/dL (ref 30.0–36.0)
MCV: 86.1 fL (ref 78.0–100.0)
Platelets: 315 10*3/uL (ref 150–400)
RBC: 3.53 MIL/uL — ABNORMAL LOW (ref 3.87–5.11)
RDW: 13.8 % (ref 11.5–15.5)
WBC: 6.5 10*3/uL (ref 4.0–10.5)

## 2013-06-26 NOTE — Progress Notes (Signed)
Doing well.  Good fetal movement, denies vaginal bleeding, LOF, regular contractions. Reviewed signs of PTL.  BTL forms updated today.

## 2013-06-26 NOTE — Progress Notes (Signed)
Pt. Signed BTL forms last visit and did not add her initial W which is on her medicaid card; BTL form signed today.  1hr gtt and 28week labs today at 1530.

## 2013-06-27 ENCOUNTER — Encounter: Payer: Self-pay | Admitting: *Deleted

## 2013-06-27 LAB — GLUCOSE TOLERANCE, 1 HOUR (50G) W/O FASTING: Glucose, 1 Hour GTT: 131 mg/dL (ref 70–140)

## 2013-06-27 LAB — HIV ANTIBODY (ROUTINE TESTING W REFLEX): HIV 1&2 Ab, 4th Generation: NONREACTIVE

## 2013-06-27 LAB — RPR

## 2013-07-05 ENCOUNTER — Emergency Department (HOSPITAL_COMMUNITY)
Admission: EM | Admit: 2013-07-05 | Discharge: 2013-07-06 | Disposition: A | Discharge status: Home / Self Care | Payer: Medicaid Other | Attending: Emergency Medicine | Admitting: Emergency Medicine

## 2013-07-05 ENCOUNTER — Encounter (HOSPITAL_COMMUNITY): Payer: Self-pay | Admitting: Emergency Medicine

## 2013-07-05 DIAGNOSIS — X500XXA Overexertion from strenuous movement or load, initial encounter: Secondary | ICD-10-CM | POA: Insufficient documentation

## 2013-07-05 DIAGNOSIS — IMO0002 Reserved for concepts with insufficient information to code with codable children: Secondary | ICD-10-CM | POA: Insufficient documentation

## 2013-07-05 DIAGNOSIS — Z79899 Other long term (current) drug therapy: Secondary | ICD-10-CM | POA: Insufficient documentation

## 2013-07-05 DIAGNOSIS — Y9302 Activity, running: Secondary | ICD-10-CM | POA: Insufficient documentation

## 2013-07-05 DIAGNOSIS — S8390XA Sprain of unspecified site of unspecified knee, initial encounter: Secondary | ICD-10-CM

## 2013-07-05 DIAGNOSIS — Y929 Unspecified place or not applicable: Secondary | ICD-10-CM | POA: Insufficient documentation

## 2013-07-05 DIAGNOSIS — Z87891 Personal history of nicotine dependence: Secondary | ICD-10-CM | POA: Insufficient documentation

## 2013-07-05 DIAGNOSIS — R296 Repeated falls: Secondary | ICD-10-CM | POA: Insufficient documentation

## 2013-07-05 NOTE — ED Notes (Signed)
Ortho tech aware of orders.  

## 2013-07-05 NOTE — Discharge Instructions (Signed)
Knee Sprain  A knee sprain is a tear in one of the strong, fibrous tissues that connect the bones (ligaments) in your knee. The severity of the sprain depends on how much of the ligament is torn. The tear can be either partial or complete.  CAUSES   Often, sprains are a result of a fall or injury. The force of the impact causes the fibers of your ligament to stretch too much. This excess tension causes the fibers of your ligament to tear.  SIGNS AND SYMPTOMS   You may have some loss of motion in your knee. Other symptoms include:   Bruising.   Pain in the knee area.   Tenderness of the knee to the touch.   Swelling.  DIAGNOSIS   To diagnose a knee sprain, your health care provider will physically examine your knee. Your health care provider may also suggest an X-ray exam of your knee to make sure no bones are broken.  TREATMENT   If your ligament is only partially torn, treatment usually involves keeping the knee in a fixed position (immobilization) or bracing your knee for activities that require movement for several weeks. To do this, your health care provider will apply a bandage, cast, or splint to keep your knee from moving and to support your knee during movement until it heals. For a partially torn ligament, the healing process usually takes 4 6 weeks.  If your ligament is completely torn, depending on which ligament it is, you may need surgery to reconnect the ligament to the bone or reconstruct it. After surgery, a cast or splint may be applied and will need to stay on your knee for 4 6 weeks while your ligament heals.  HOME CARE INSTRUCTIONS   Keep your injured knee elevated to decrease swelling.   To ease pain and swelling, apply ice to the injured area:   Put ice in a plastic bag.   Place a towel between your skin and the bag.   Leave the ice on for 20 minutes, 2 3 times a day.   Only take medicine for pain as directed by your health care provider.   Do not leave your knee unprotected until  pain and stiffness go away (usually 4 6 weeks).   If you have a cast or splint, do not allow it to get wet. If you have been instructed not to remove it, cover it with a plastic bag when you shower or bathe. Do not swim.   Your health care provider may suggest exercises for you to do during your recovery to prevent or limit permanent weakness and stiffness.  SEEK IMMEDIATE MEDICAL CARE IF:   Your cast or splint becomes damaged.   Your pain becomes worse.   You have significant pain, swelling, or numbness below the cast or splint.  MAKE SURE YOU:   Understand these instructions.   Will watch your condition.   Will get help right away if you are not doing well or get worse.  Document Released: 02/22/2005 Document Revised: 12/13/2012 Document Reviewed: 10/04/2012  Exit[employer] Patient Information 2014 [employer].

## 2013-07-05 NOTE — Progress Notes (Signed)
Orthopedic Tech Progress Note Patient Details:  [name] BrighamOriel W [name] Apr 12, 1980 [id number]  Ortho Devices Type of Ortho Device: Knee Immobilizer;Crutches   [name] 07/05/2013, 11:53 PM

## 2013-07-05 NOTE — ED Notes (Addendum)
Fell when running after bf.  The left knee gave out and hit ground hard. Difficult to bend.

## 2013-07-05 NOTE — ED Provider Notes (Signed)
CSN: [id number]     Arrival date & time 07/05/13  2130 History  This chart was scribed for Roxy Horsemanobert Jayde Daffin, PA, working with Olivia Mackielga M Otter, MD, by Bronson CurbJacqueline Melvin, ED Scribe. This patient was seen in room TR08C/TR08C and the patient's care was started at 11:32 PM.    Chief Complaint  Patient presents with  . Knee Pain     The history is provided by the patient. No language interpreter was used.   HPI Comments: [name] [name] is a 33 y.o. female who presents to the Emergency Department with a chief complaint of constant, moderate left knee pain the began today as a result of a fall that occurred at 2:00 PM. Patient states she was running when she felt something in her left knee "pop" and she collapsed to the ground. There is associated swelling. She states the pain is relieved with heat and worsened with movement. She denies any other injuries. Patient states that she is [redacted] weeks pregnant. No abdominal pain.   Past Medical History  Diagnosis Date  . Medical history non-contributory    Past Surgical History  Procedure Laterality Date  . Cesarean section    . Dilation and evacuation N/A 09/12/2012    Procedure: DILATATION AND EVACUATION;  Surgeon: Adam PhenixJames G Arnold, MD;  Location: [facility];  Service: Gynecology;  Laterality: N/A;   Family History  Problem Relation Age of Onset  . Diabetes Mother   . Hypertension Mother   . Hypertension Father    History  Substance Use Topics  . Smoking status: Former Smoker    Types: Cigarettes  . Smokeless tobacco: Never Used  . Alcohol Use: No   OB History   Grav Para Term Preterm Abortions TAB SAB Ect Mult Living   6 2 1 1 3 1 2  0 0 2     Review of Systems  Constitutional: Negative for fever and chills.  Respiratory: Negative for shortness of breath.   Cardiovascular: Negative for chest pain.  Gastrointestinal: Negative for nausea, vomiting, diarrhea and constipation.  Genitourinary: Negative for dysuria.  Musculoskeletal: Positive for  joint swelling.       Left knee pain.      Allergies  Review of patient's allergies indicates no known allergies.  Home Medications   Prior to Admission medications   Medication Sig Start Date End Date Taking? Authorizing Provider  Prenatal Vit-Fe Fumarate-FA (PRENATAL MULTIVITAMIN) TABS tablet Take 1 tablet by mouth daily at 12 noon.    Historical Provider, MD   Triage Vitals: BP 130/69  Pulse 99  Temp(Src) 98.7 F (37.1 C) (Oral)  Resp 18  SpO2 99%  LMP 11/28/2012  Physical Exam  Nursing note and vitals reviewed. Constitutional: She is oriented to person, place, and time. She appears well-developed and well-nourished. No distress.  HENT:  Head: Normocephalic and atraumatic.  Eyes: Conjunctivae and EOM are normal.  Neck: Normal range of motion. Neck supple. No tracheal deviation present.  Cardiovascular: Normal rate.   Pulmonary/Chest: Effort normal. No respiratory distress.  Abdominal: She exhibits no distension.  Musculoskeletal: Normal range of motion.  Right knee ROM and strength 5/5, no bony abnormality or deformity  Neurological: She is alert and oriented to person, place, and time.  Skin: Skin is warm and dry.  Psychiatric: She has a normal mood and affect. Her behavior is normal. Judgment and thought content normal.    ED Course  Procedures (including critical care time)  DIAGNOSTIC STUDIES: Oxygen Saturation is 99% on room air,  normal by my interpretation.    COORDINATION OF CARE: At 11:36 PM Discussed treatment plan with patient which includes crutches and knee immobilizer and advised to f/u with orthopedic doctor. Patient agrees.   Labs Review Labs Reviewed - No data to display  Imaging Review No results found.   EKG Interpretation None      MDM   Final diagnoses:  Knee sprain    Patient with knee pain following a fall today.  Felt like knee twisted.  Clears [place] knee rules, therefore will not image.  Patient agrees with no imaging.   Will place the patient in a knee immobilizer and crutches.  Suspect possible ligamentous injury.  Will recommend orthopedic follow-up.  I personally performed the services described in this documentation, which was scribed in my presence. The recorded information has been reviewed and is accurate.     Roxy Horsemanobert Amorina Doerr, PA-C 07/05/13 2354

## 2013-07-06 NOTE — ED Provider Notes (Signed)
Medical screening examination/treatment/procedure(s) were performed by non-physician practitioner and as supervising physician I was immediately available for consultation/collaboration.   EKG Interpretation None       Nadia Viar M Tabby Beaston, MD 07/06/13 0730 

## 2013-07-09 ENCOUNTER — Telehealth: Payer: Self-pay | Admitting: *Deleted

## 2013-07-09 DIAGNOSIS — B379 Candidiasis, unspecified: Secondary | ICD-10-CM

## 2013-07-09 MED ORDER — FLUCONAZOLE 150 MG PO TABS
150.0000 mg | ORAL_TABLET | Freq: Once | ORAL | Status: DC
Start: 2013-07-09 — End: 2013-07-20

## 2013-07-09 NOTE — Telephone Encounter (Signed)
Pt called nurse line requesting prescription for yeast infection, unable to purchase over the counter due to finances.  Contacted patient, pharmacy verified, medication ordered.

## 2013-07-11 ENCOUNTER — Encounter: Payer: Medicaid Other | Admitting: Family Medicine

## 2013-07-20 ENCOUNTER — Ambulatory Visit (INDEPENDENT_AMBULATORY_CARE_PROVIDER_SITE_OTHER): Payer: Medicaid Other | Admitting: Obstetrics and Gynecology

## 2013-07-20 VITALS — BP 110/68 | HR 84 | Temp 98.4°F

## 2013-07-20 DIAGNOSIS — O99340 Other mental disorders complicating pregnancy, unspecified trimester: Secondary | ICD-10-CM

## 2013-07-20 DIAGNOSIS — O0930 Supervision of pregnancy with insufficient antenatal care, unspecified trimester: Secondary | ICD-10-CM

## 2013-07-20 DIAGNOSIS — F1910 Other psychoactive substance abuse, uncomplicated: Secondary | ICD-10-CM

## 2013-07-20 DIAGNOSIS — O0933 Supervision of pregnancy with insufficient antenatal care, third trimester: Secondary | ICD-10-CM | POA: Insufficient documentation

## 2013-07-20 DIAGNOSIS — O99323 Drug use complicating pregnancy, third trimester: Secondary | ICD-10-CM

## 2013-07-20 DIAGNOSIS — Z3480 Encounter for supervision of other normal pregnancy, unspecified trimester: Secondary | ICD-10-CM

## 2013-07-20 LAB — POCT URINALYSIS DIP (DEVICE)
BILIRUBIN URINE: NEGATIVE
GLUCOSE, UA: NEGATIVE mg/dL
Hgb urine dipstick: NEGATIVE
KETONES UR: NEGATIVE mg/dL
Leukocytes, UA: NEGATIVE
Nitrite: NEGATIVE
PH: 8.5 — AB (ref 5.0–8.0)
Protein, ur: NEGATIVE mg/dL
SPECIFIC GRAVITY, URINE: 1.015 (ref 1.005–1.030)
Urobilinogen, UA: 0.2 mg/dL (ref 0.0–1.0)

## 2013-07-20 NOTE — Progress Notes (Signed)
Reports occasional pelvic pressure and braxton hicks.   

## 2013-07-20 NOTE — Patient Instructions (Signed)
Third Trimester of Pregnancy  The third trimester is from week 29 through week 42, months 7 through 9. The third trimester is a time when the fetus is growing rapidly. At the end of the ninth month, the fetus is about 20 inches in length and weighs 6 10 pounds.   BODY CHANGES  Your body goes through many changes during pregnancy. The changes vary from woman to woman.    Your weight will continue to increase. You can expect to gain 25 35 pounds (11 16 kg) by the end of the pregnancy.   You may begin to get stretch marks on your hips, abdomen, and breasts.   You may urinate more often because the fetus is moving lower into your pelvis and pressing on your bladder.   You may develop or continue to have heartburn as a result of your pregnancy.   You may develop constipation because certain hormones are causing the muscles that push waste through your intestines to slow down.   You may develop hemorrhoids or swollen, bulging veins (varicose veins).   You may have pelvic pain because of the weight gain and pregnancy hormones relaxing your joints between the bones in your pelvis. Back aches may result from over exertion of the muscles supporting your posture.   Your breasts will continue to grow and be tender. A yellow discharge may leak from your breasts called colostrum.   Your belly button may stick out.   You may feel short of breath because of your expanding uterus.   You may notice the fetus "dropping," or moving lower in your abdomen.   You may have a bloody mucus discharge. This usually occurs a few days to a week before labor begins.   Your cervix becomes thin and soft (effaced) near your due date.  WHAT TO EXPECT AT YOUR PRENATAL EXAMS   You will have prenatal exams every 2 weeks until week 36. Then, you will have weekly prenatal exams. During a routine prenatal visit:   You will be weighed to make sure you and the fetus are growing normally.   Your blood pressure is taken.   Your abdomen will be  measured to track your baby's growth.   The fetal heartbeat will be listened to.   Any test results from the previous visit will be discussed.   You may have a cervical check near your due date to see if you have effaced.  At around 36 weeks, your caregiver will check your cervix. At the same time, your caregiver will also perform a test on the secretions of the vaginal tissue. This test is to determine if a type of bacteria, Group B streptococcus, is present. Your caregiver will explain this further.  Your caregiver may ask you:   What your birth plan is.   How you are feeling.   If you are feeling the baby move.   If you have had any abnormal symptoms, such as leaking fluid, bleeding, severe headaches, or abdominal cramping.   If you have any questions.  Other tests or screenings that may be performed during your third trimester include:   Blood tests that check for low iron levels (anemia).   Fetal testing to check the health, activity level, and growth of the fetus. Testing is done if you have certain medical conditions or if there are problems during the pregnancy.  FALSE LABOR  You may feel small, irregular contractions that eventually go away. These are called Braxton Hicks contractions, or   false labor. Contractions may last for hours, days, or even weeks before true labor sets in. If contractions come at regular intervals, intensify, or become painful, it is best to be seen by your caregiver.   SIGNS OF LABOR    Menstrual-like cramps.   Contractions that are 5 minutes apart or less.   Contractions that start on the top of the uterus and spread down to the lower abdomen and back.   A sense of increased pelvic pressure or back pain.   A watery or bloody mucus discharge that comes from the vagina.  If you have any of these signs before the 37th week of pregnancy, call your caregiver right away. You need to go to the hospital to get checked immediately.  HOME CARE INSTRUCTIONS    Avoid all  smoking, herbs, alcohol, and unprescribed drugs. These chemicals affect the formation and growth of the baby.   Follow your caregiver's instructions regarding medicine use. There are medicines that are either safe or unsafe to take during pregnancy.   Exercise only as directed by your caregiver. Experiencing uterine cramps is a good sign to stop exercising.   Continue to eat regular, healthy meals.   Wear a good support bra for breast tenderness.   Do not use hot tubs, steam rooms, or saunas.   Wear your seat belt at all times when driving.   Avoid raw meat, uncooked cheese, cat litter boxes, and soil used by cats. These carry germs that can cause birth defects in the baby.   Take your prenatal vitamins.   Try taking a stool softener (if your caregiver approves) if you develop constipation. Eat more high-fiber foods, such as fresh vegetables or fruit and whole grains. Drink plenty of fluids to keep your urine clear or pale yellow.   Take warm sitz baths to soothe any pain or discomfort caused by hemorrhoids. Use hemorrhoid cream if your caregiver approves.   If you develop varicose veins, wear support hose. Elevate your feet for 15 minutes, 3 4 times a day. Limit salt in your diet.   Avoid heavy lifting, wear low heal shoes, and practice good posture.   Rest a lot with your legs elevated if you have leg cramps or low back pain.   Visit your dentist if you have not gone during your pregnancy. Use a soft toothbrush to brush your teeth and be gentle when you floss.   A sexual relationship may be continued unless your caregiver directs you otherwise.   Do not travel far distances unless it is absolutely necessary and only with the approval of your caregiver.   Take prenatal classes to understand, practice, and ask questions about the labor and delivery.   Make a trial run to the hospital.   Pack your hospital bag.   Prepare the baby's nursery.   Continue to go to all your prenatal visits as directed  by your caregiver.  SEEK MEDICAL CARE IF:   You are unsure if you are in labor or if your water has broken.   You have dizziness.   You have mild pelvic cramps, pelvic pressure, or nagging pain in your abdominal area.   You have persistent nausea, vomiting, or diarrhea.   You have a bad smelling vaginal discharge.   You have pain with urination.  SEEK IMMEDIATE MEDICAL CARE IF:    You have a fever.   You are leaking fluid from your vagina.   You have spotting or bleeding from your vagina.     You have severe abdominal cramping or pain.   You have rapid weight loss or gain.   You have shortness of breath with chest pain.   You notice sudden or extreme swelling of your face, hands, ankles, feet, or legs.   You have not felt your baby move in over an hour.   You have severe headaches that do not go away with medicine.   You have vision changes.  Document Released: 02/16/2001 Document Revised: 10/25/2012 Document Reviewed: 04/25/2012  [employer] Patient Information 2014 [employer].

## 2013-07-20 NOTE — Progress Notes (Signed)
F/U UDS sent. RLP discussed. Circumcision info given.

## 2013-07-24 LAB — ALCOHOL METABOLITE (ETG), URINE: Ethyl Glucuronide (EtG): NEGATIVE ng/mL

## 2013-08-03 LAB — CANNABANOIDS (GC/LC/MS), URINE

## 2013-08-04 LAB — PRESCRIPTION MONITORING PROFILE (19 PANEL)
AMPHETAMINE/METH: NEGATIVE ng/mL
BARBITURATE SCREEN, URINE: NEGATIVE ng/mL
BENZODIAZEPINE SCREEN, URINE: NEGATIVE ng/mL
BUPRENORPHINE, URINE: NEGATIVE ng/mL
Carisoprodol, Urine: NEGATIVE ng/mL
Cocaine Metabolites: NEGATIVE ng/mL
Creatinine, Urine: 276.1 mg/dL (ref >20.0)
Fentanyl, Ur: NEGATIVE ng/mL
MDMA URINE: NEGATIVE ng/mL
METHAQUALONE SCREEN (URINE): NEGATIVE ng/mL
Meperidine, Ur: NEGATIVE ng/mL
Methadone Screen, Urine: NEGATIVE ng/mL
NITRITES URINE, INITIAL: NEGATIVE ug/mL
OPIATE SCREEN, URINE: NEGATIVE ng/mL
Oxycodone Screen, Ur: NEGATIVE ng/mL
PROPOXYPHENE: NEGATIVE ng/mL
Phencyclidine, Ur: NEGATIVE ng/mL
TAPENTADOLUR: NEGATIVE ng/mL
Tramadol Scrn, Ur: NEGATIVE ng/mL
ZOLPIDEM, URINE: NEGATIVE ng/mL
pH, Initial: 7.2 pH (ref 4.5–8.9)

## 2013-08-09 ENCOUNTER — Encounter (HOSPITAL_COMMUNITY): Payer: Self-pay | Admitting: Emergency Medicine

## 2013-08-09 ENCOUNTER — Emergency Department (HOSPITAL_COMMUNITY): Payer: Medicaid Other

## 2013-08-09 ENCOUNTER — Emergency Department (HOSPITAL_COMMUNITY)
Admission: EM | Admit: 2013-08-09 | Discharge: 2013-08-09 | Disposition: A | Discharge status: Home / Self Care | Payer: Medicaid Other | Attending: Emergency Medicine | Admitting: Emergency Medicine

## 2013-08-09 DIAGNOSIS — O9989 Other specified diseases and conditions complicating pregnancy, childbirth and the puerperium: Secondary | ICD-10-CM | POA: Insufficient documentation

## 2013-08-09 DIAGNOSIS — X500XXA Overexertion from strenuous movement or load, initial encounter: Secondary | ICD-10-CM | POA: Insufficient documentation

## 2013-08-09 DIAGNOSIS — S93409A Sprain of unspecified ligament of unspecified ankle, initial encounter: Secondary | ICD-10-CM | POA: Insufficient documentation

## 2013-08-09 DIAGNOSIS — Y9302 Activity, running: Secondary | ICD-10-CM | POA: Insufficient documentation

## 2013-08-09 DIAGNOSIS — Y929 Unspecified place or not applicable: Secondary | ICD-10-CM | POA: Insufficient documentation

## 2013-08-09 DIAGNOSIS — Z79899 Other long term (current) drug therapy: Secondary | ICD-10-CM | POA: Insufficient documentation

## 2013-08-09 DIAGNOSIS — IMO0002 Reserved for concepts with insufficient information to code with codable children: Secondary | ICD-10-CM | POA: Insufficient documentation

## 2013-08-09 DIAGNOSIS — Z87891 Personal history of nicotine dependence: Secondary | ICD-10-CM | POA: Insufficient documentation

## 2013-08-09 DIAGNOSIS — S93402A Sprain of unspecified ligament of left ankle, initial encounter: Secondary | ICD-10-CM

## 2013-08-09 DIAGNOSIS — R296 Repeated falls: Secondary | ICD-10-CM | POA: Insufficient documentation

## 2013-08-09 MED ORDER — ACETAMINOPHEN 325 MG PO TABS
650.0000 mg | ORAL_TABLET | Freq: Once | ORAL | Status: AC
  Administered 2013-08-09: 650 mg via ORAL
  Filled 2013-08-09: qty 2

## 2013-08-09 NOTE — ED Notes (Signed)
Pt is approx [redacted] weeks pregnant.

## 2013-08-09 NOTE — ED Provider Notes (Signed)
CSN: [id number]     Arrival date & time 08/09/13  1636 History   First MD Initiated Contact with Patient 08/09/13 1657     Chief Complaint  Patient presents with  . Ankle Pain  . Arm Pain     (Consider location/radiation/quality/duration/timing/severity/associated sxs/prior Treatment) Patient is a 33 y.o. female presenting with ankle pain and arm pain. The history is provided by the patient.  Ankle Pain Location:  Ankle Injury: yes   Mechanism of injury: fall   Fall:    Fall occurred: from standing, while running.   Height of fall:  From standing   Impact surface: concrete.   Point of impact: left flank.   Entrapped after fall: no   Ankle location:  L ankle Pain details:    Quality:  Aching   Radiates to:  Does not radiate   Severity:  Mild   Onset quality:  Sudden   Duration:  1 hour   Timing:  Constant   Progression:  Partially resolved Chronicity:  New Dislocation: no   Foreign body present:  No foreign bodies Tetanus status:  Up to date Prior injury to area:  No Relieved by:  Nothing Worsened by:  Bearing weight Ineffective treatments:  None tried Associated symptoms: no back pain, no fatigue, no fever and no neck pain   Arm Pain Pertinent negatives include no chest pain, no abdominal pain, no headaches and no shortness of breath.    Past Medical History  Diagnosis Date  . Medical history non-contributory    Past Surgical History  Procedure Laterality Date  . Cesarean section    . Dilation and evacuation N/A 09/12/2012    Procedure: DILATATION AND EVACUATION;  Surgeon: Adam Phenix, MD;  Location: [facility];  Service: Gynecology;  Laterality: N/A;   Family History  Problem Relation Age of Onset  . Diabetes Mother   . Hypertension Mother   . Hypertension Father    History  Substance Use Topics  . Smoking status: Former Smoker    Types: Cigarettes  . Smokeless tobacco: Never Used  . Alcohol Use: No   OB History   Grav Para Term Preterm Abortions TAB  SAB Ect Mult Living   6 2 1 1 3 1 2  0 0 2     Review of Systems  Constitutional: Negative for fever and fatigue.  HENT: Negative for congestion and drooling.   Eyes: Negative for pain.  Respiratory: Negative for cough and shortness of breath.   Cardiovascular: Negative for chest pain.  Gastrointestinal: Negative for nausea, vomiting, abdominal pain and diarrhea.  Genitourinary: Negative for dysuria and hematuria.  Musculoskeletal: Negative for back pain, gait problem and neck pain.  Skin: Negative for color change.  Neurological: Negative for dizziness and headaches.  Hematological: Negative for adenopathy.  Psychiatric/Behavioral: Negative for behavioral problems.  All other systems reviewed and are negative.     Allergies  Review of patient's allergies indicates no known allergies.  Home Medications   Prior to Admission medications   Medication Sig Start Date End Date Taking? Authorizing Provider  Prenatal Vit-Fe Fumarate-FA (PRENATAL MULTIVITAMIN) TABS tablet Take 1 tablet by mouth daily at 12 noon.   Yes Historical Provider, MD   BP 110/60  Pulse 91  Temp(Src) 98.5 F (36.9 C) (Oral)  Resp 19  SpO2 100%  LMP 11/28/2012 Physical Exam  Nursing note and vitals reviewed. Constitutional: She is oriented to person, place, and time. She appears well-developed and well-nourished.  HENT:  Head: Normocephalic and  atraumatic.  Mouth/Throat: Oropharynx is clear and moist. No oropharyngeal exudate.  Eyes: Conjunctivae and EOM are normal. Pupils are equal, round, and reactive to light.  Neck: Normal range of motion. Neck supple.  No cervical or other vertebral ttp.   Cardiovascular: Normal rate, regular rhythm, normal heart sounds and intact distal pulses.  Exam reveals no gallop and no friction rub.   No murmur heard. Pulmonary/Chest: Effort normal and breath sounds normal. No respiratory distress. She has no wheezes.  Abdominal: Soft. Bowel sounds are normal. There is no  tenderness. There is no rebound and no guarding.  Gravid, no ttp.   Musculoskeletal: Normal range of motion. She exhibits no edema and no tenderness.  Very mild abrasion to left lateral forearm. No evidence of bleeding.   Mild ttp of left lateral ankle.   Normal rom of bilateral hips w/out pain.   Neurological: She is alert and oriented to person, place, and time. She has normal strength.  Skin: Skin is warm and dry.  Psychiatric: She has a normal mood and affect. Her behavior is normal.    ED Course  Procedures (including critical care time) Labs Review Labs Reviewed - No data to display  Imaging Review Dg Ankle Complete Left  08/09/2013   CLINICAL DATA:  Fall, lateral ankle pain  EXAM: LEFT ANKLE COMPLETE - 3+ VIEW  COMPARISON:  None.  FINDINGS: There is no evidence of fracture, dislocation, or joint effusion. There is no evidence of arthropathy or other focal bone abnormality. Soft tissues are unremarkable.  IMPRESSION: Negative.   Electronically Signed   By: Christiana PellantGretchen  Green M.D.   On: 08/09/2013 17:34     EKG Interpretation None      MDM   Final diagnoses:  Left ankle sprain    5:10 PM 33 y.o. female [id number] at 35 wks who presents with a mechanical fall which occurred prior to arrival. She states that she was running outside when she had a mechanical fall falling on her left side. She denies hitting her abdomen. She denies hitting her head or having loss of consciousness. She braced her fall with her hands. She has some very mild abrasions to her left lateral forearm and some left lateral ankle pain. She denies any abdominal pain or other associated symptoms. She does not want pain medicine. She states that she is feeling the baby move, denies any loss of fluid, denies any vaginal bleeding. She states she has had an uncomplicated pregnancy thus far. She is afebrile and vital signs are unremarkable here. Will get screening plain film of left ankle and place on tocometry.  9:11  PM: Pt continues to appear well. Has been observed on tocometry for 4 hours. Ok for d/c per obgyn who is reviewing her strip remotely. Likely ankle sprain.  I have discussed the diagnosis/risks/treatment options with the patient and believe the pt to be eligible for discharge home to follow-up with pcp as needed. We also discussed returning to the ED immediately if new or worsening sx occur. We discussed the sx which are most concerning (e.g., abd pain, LOF, vaginal bleeding) that necessitate immediate return. Medications administered to the patient during their visit and any new prescriptions provided to the patient are listed below.  Medications given during this visit Medications  acetaminophen (TYLENOL) tablet 650 mg (not administered)    Discharge Medication List as of 08/09/2013  9:07 PM       Randa SpikeForrest Mort SawyersS Maudie Shingledecker, MD 08/09/13 2322

## 2013-08-09 NOTE — Progress Notes (Signed)
Power fluctuation, OBIX failed.

## 2013-08-09 NOTE — Progress Notes (Signed)
1703  Arrived to evaluate this 33 yo G6P2 @ 36.[redacted]wks GA with complaint of fall.  Patient reports falling off a low porch and landing on left side. States she put her left arm out "to protect my belly".  Complains of left ankle and lower leg pain. Denies contractions, abdominal pain, vaginal bleeding, or LOF.  Reports good fetal movement.  Denies pregnancy complications. 1740  Dr. Jolayne Panther notified of patient in the ED.  Informed of patient complaint and history, EFM, and ED plan of care.  Orders to continue monitoring for a total of 4 hours per [facility] policy.  RROB RN to call FP attending at the end of the 4 hours of monitoring with update.

## 2013-08-09 NOTE — ED Notes (Signed)
Pt states that she was running and twisted her left ankle, falling to ground on left side. Pt has abrasion on left forearm.  Pt is pregnant and due June 25.  Pt denies hitting her tummy. Pt denies LOC.

## 2013-08-09 NOTE — Progress Notes (Signed)
OBIX relaunched after power fluctuation

## 2013-08-09 NOTE — Discharge Instructions (Signed)

## 2013-08-10 ENCOUNTER — Encounter: Payer: Medicaid Other | Admitting: Obstetrics and Gynecology

## 2013-08-10 ENCOUNTER — Encounter: Payer: Self-pay | Admitting: *Deleted

## 2013-08-10 ENCOUNTER — Telehealth: Payer: Self-pay | Admitting: *Deleted

## 2013-08-10 NOTE — Telephone Encounter (Signed)
Attempted to call patient on several numbers, unable to leave a message or number incorrect.  Will send letter.  Letter sent.

## 2013-09-04 ENCOUNTER — Encounter (HOSPITAL_COMMUNITY): Payer: Medicaid Other | Admitting: Anesthesiology

## 2013-09-04 ENCOUNTER — Inpatient Hospital Stay (HOSPITAL_COMMUNITY): Payer: Medicaid Other | Admitting: Anesthesiology

## 2013-09-04 ENCOUNTER — Encounter (HOSPITAL_COMMUNITY)
Admission: AD | Disposition: A | Discharge status: Home / Self Care | Payer: Self-pay | Source: Ambulatory Visit | Attending: Obstetrics & Gynecology

## 2013-09-04 ENCOUNTER — Encounter (HOSPITAL_COMMUNITY): Payer: Self-pay | Admitting: *Deleted

## 2013-09-04 ENCOUNTER — Inpatient Hospital Stay (HOSPITAL_COMMUNITY)
Admission: AD | Admit: 2013-09-04 | Discharge: 2013-09-07 | DRG: 765 | Disposition: A | Discharge status: Home / Self Care | Payer: Medicaid Other | Source: Ambulatory Visit | Attending: Obstetrics & Gynecology | Admitting: Obstetrics & Gynecology

## 2013-09-04 DIAGNOSIS — E669 Obesity, unspecified: Secondary | ICD-10-CM | POA: Diagnosis present

## 2013-09-04 DIAGNOSIS — O0933 Supervision of pregnancy with insufficient antenatal care, third trimester: Secondary | ICD-10-CM

## 2013-09-04 DIAGNOSIS — Z87891 Personal history of nicotine dependence: Secondary | ICD-10-CM

## 2013-09-04 DIAGNOSIS — O99322 Drug use complicating pregnancy, second trimester: Secondary | ICD-10-CM

## 2013-09-04 DIAGNOSIS — Z823 Family history of stroke: Secondary | ICD-10-CM

## 2013-09-04 DIAGNOSIS — Z6841 Body Mass Index (BMI) 40.0 and over, adult: Secondary | ICD-10-CM

## 2013-09-04 DIAGNOSIS — Z833 Family history of diabetes mellitus: Secondary | ICD-10-CM | POA: Diagnosis not present

## 2013-09-04 DIAGNOSIS — Z8249 Family history of ischemic heart disease and other diseases of the circulatory system: Secondary | ICD-10-CM

## 2013-09-04 DIAGNOSIS — O34219 Maternal care for unspecified type scar from previous cesarean delivery: Secondary | ICD-10-CM | POA: Diagnosis present

## 2013-09-04 DIAGNOSIS — Z9889 Other specified postprocedural states: Secondary | ICD-10-CM

## 2013-09-04 DIAGNOSIS — O99214 Obesity complicating childbirth: Secondary | ICD-10-CM

## 2013-09-04 DIAGNOSIS — Z302 Encounter for sterilization: Secondary | ICD-10-CM

## 2013-09-04 DIAGNOSIS — O479 False labor, unspecified: Secondary | ICD-10-CM | POA: Diagnosis present

## 2013-09-04 DIAGNOSIS — O034 Incomplete spontaneous abortion without complication: Secondary | ICD-10-CM

## 2013-09-04 LAB — CBC
HCT: 32.2 % — ABNORMAL LOW (ref 36.0–46.0)
Hemoglobin: 10.6 g/dL — ABNORMAL LOW (ref 12.0–15.0)
MCH: 29.9 pg (ref 26.0–34.0)
MCHC: 32.9 g/dL (ref 30.0–36.0)
MCV: 91 fL (ref 78.0–100.0)
PLATELETS: 299 10*3/uL (ref 150–400)
RBC: 3.54 MIL/uL — ABNORMAL LOW (ref 3.87–5.11)
RDW: 13.9 % (ref 11.5–15.5)
WBC: 7.4 10*3/uL (ref 4.0–10.5)

## 2013-09-04 LAB — TYPE AND SCREEN
ABO/RH(D): A POS
Antibody Screen: NEGATIVE

## 2013-09-04 LAB — RAPID URINE DRUG SCREEN, HOSP PERFORMED
Amphetamines: NOT DETECTED
Barbiturates: NOT DETECTED
Benzodiazepines: NOT DETECTED
COCAINE: NOT DETECTED
OPIATES: NOT DETECTED
Tetrahydrocannabinol: NOT DETECTED

## 2013-09-04 LAB — RPR

## 2013-09-04 SURGERY — Surgical Case
Anesthesia: Spinal

## 2013-09-04 MED ORDER — OXYTOCIN 10 UNIT/ML IJ SOLN
40.0000 [IU] | INTRAVENOUS | Status: DC | PRN
  Administered 2013-09-04: 40 [IU] via INTRAVENOUS

## 2013-09-04 MED ORDER — NALBUPHINE HCL 10 MG/ML IJ SOLN: 5.0000 mg | INTRAMUSCULAR | Status: DC | PRN

## 2013-09-04 MED ORDER — IBUPROFEN 600 MG PO TABS
600.0000 mg | ORAL_TABLET | Freq: Four times a day (QID) | ORAL | Status: DC
  Administered 2013-09-04 – 2013-09-07 (×11): 600 mg via ORAL
  Filled 2013-09-04 (×9): qty 1

## 2013-09-04 MED ORDER — MORPHINE SULFATE 0.5 MG/ML IJ SOLN
INTRAMUSCULAR | Status: AC
  Filled 2013-09-04: qty 10

## 2013-09-04 MED ORDER — SIMETHICONE 80 MG PO CHEW
80.0000 mg | CHEWABLE_TABLET | Freq: Three times a day (TID) | ORAL | Status: DC
Start: 2013-09-05 — End: 2013-09-07
  Administered 2013-09-05 – 2013-09-07 (×4): 80 mg via ORAL
  Filled 2013-09-04 (×5): qty 1

## 2013-09-04 MED ORDER — ZOLPIDEM TARTRATE 5 MG PO TABS: 5.0000 mg | ORAL_TABLET | Freq: Every evening | ORAL | Status: DC | PRN

## 2013-09-04 MED ORDER — SCOPOLAMINE 1 MG/3DAYS TD PT72
1.0000 | MEDICATED_PATCH | Freq: Once | TRANSDERMAL | Status: DC
  Administered 2013-09-04: 1.5 mg via TRANSDERMAL

## 2013-09-04 MED ORDER — HYDROMORPHONE HCL PF 1 MG/ML IJ SOLN: 0.2500 mg | INTRAMUSCULAR | Status: DC | PRN

## 2013-09-04 MED ORDER — NALOXONE HCL 0.4 MG/ML IJ SOLN: 0.4000 mg | INTRAMUSCULAR | Status: DC | PRN

## 2013-09-04 MED ORDER — BUPIVACAINE HCL (PF) 0.5 % IJ SOLN
INTRAMUSCULAR | Status: DC | PRN
  Administered 2013-09-04: 30 mL

## 2013-09-04 MED ORDER — OXYCODONE-ACETAMINOPHEN 5-325 MG PO TABS
1.0000 | ORAL_TABLET | ORAL | Status: DC | PRN
  Administered 2013-09-05 – 2013-09-06 (×2): 1 via ORAL
  Administered 2013-09-07 (×2): 2 via ORAL
  Filled 2013-09-04: qty 2
  Filled 2013-09-04 (×2): qty 1
  Filled 2013-09-04: qty 2

## 2013-09-04 MED ORDER — PROMETHAZINE HCL 25 MG/ML IJ SOLN
6.2500 mg | INTRAMUSCULAR | Status: DC | PRN
Start: 2013-09-04 — End: 2013-09-04

## 2013-09-04 MED ORDER — PHENYLEPHRINE 8 MG IN D5W 100 ML (0.08MG/ML) PREMIX OPTIME
INJECTION | INTRAVENOUS | Status: AC
  Filled 2013-09-04: qty 100

## 2013-09-04 MED ORDER — NALBUPHINE HCL 10 MG/ML IJ SOLN
5.0000 mg | INTRAMUSCULAR | Status: DC | PRN
  Administered 2013-09-04: 10 mg via INTRAVENOUS
  Filled 2013-09-04: qty 1

## 2013-09-04 MED ORDER — FAMOTIDINE IN NACL 20-0.9 MG/50ML-% IV SOLN
20.0000 mg | Freq: Once | INTRAVENOUS | Status: AC
  Administered 2013-09-04: 20 mg via INTRAVENOUS
  Filled 2013-09-04: qty 50

## 2013-09-04 MED ORDER — DIBUCAINE 1 % RE OINT: 1.0000 "application " | TOPICAL_OINTMENT | RECTAL | Status: DC | PRN

## 2013-09-04 MED ORDER — DIPHENHYDRAMINE HCL 50 MG/ML IJ SOLN: 25.0000 mg | INTRAMUSCULAR | Status: DC | PRN

## 2013-09-04 MED ORDER — KETOROLAC TROMETHAMINE 30 MG/ML IJ SOLN
INTRAMUSCULAR | Status: AC
  Filled 2013-09-04: qty 1

## 2013-09-04 MED ORDER — FENTANYL CITRATE 0.05 MG/ML IJ SOLN
INTRAMUSCULAR | Status: DC | PRN
  Administered 2013-09-04: 12.5 ug via INTRATHECAL

## 2013-09-04 MED ORDER — SODIUM CHLORIDE 0.9 % IJ SOLN: 3.0000 mL | INTRAMUSCULAR | Status: DC | PRN

## 2013-09-04 MED ORDER — OXYTOCIN 10 UNIT/ML IJ SOLN
INTRAMUSCULAR | Status: AC
  Filled 2013-09-04: qty 4

## 2013-09-04 MED ORDER — BUPIVACAINE IN DEXTROSE 0.75-8.25 % IT SOLN
INTRATHECAL | Status: DC | PRN
  Administered 2013-09-04: 12 mg via INTRATHECAL

## 2013-09-04 MED ORDER — FENTANYL CITRATE 0.05 MG/ML IJ SOLN
INTRAMUSCULAR | Status: AC
  Filled 2013-09-04: qty 2

## 2013-09-04 MED ORDER — ONDANSETRON HCL 4 MG PO TABS: 4.0000 mg | ORAL_TABLET | ORAL | Status: DC | PRN

## 2013-09-04 MED ORDER — LACTATED RINGERS IV SOLN
INTRAVENOUS | Status: DC
  Administered 2013-09-05 (×2): via INTRAVENOUS

## 2013-09-04 MED ORDER — WITCH HAZEL-GLYCERIN EX PADS: 1.0000 "application " | MEDICATED_PAD | CUTANEOUS | Status: DC | PRN

## 2013-09-04 MED ORDER — DIPHENHYDRAMINE HCL 50 MG/ML IJ SOLN: 12.5000 mg | INTRAMUSCULAR | Status: DC | PRN

## 2013-09-04 MED ORDER — MORPHINE SULFATE (PF) 0.5 MG/ML IJ SOLN
INTRAMUSCULAR | Status: DC | PRN
  Administered 2013-09-04: .2 mg via INTRATHECAL

## 2013-09-04 MED ORDER — LANOLIN HYDROUS EX OINT: 1.0000 "application " | TOPICAL_OINTMENT | CUTANEOUS | Status: DC | PRN

## 2013-09-04 MED ORDER — BUPIVACAINE HCL (PF) 0.5 % IJ SOLN
INTRAMUSCULAR | Status: AC
  Filled 2013-09-04: qty 30

## 2013-09-04 MED ORDER — SIMETHICONE 80 MG PO CHEW
80.0000 mg | CHEWABLE_TABLET | ORAL | Status: DC
  Administered 2013-09-04 – 2013-09-07 (×3): 80 mg via ORAL
  Filled 2013-09-04 (×2): qty 1

## 2013-09-04 MED ORDER — KETOROLAC TROMETHAMINE 30 MG/ML IJ SOLN: 30.0000 mg | Freq: Four times a day (QID) | INTRAMUSCULAR | Status: AC | PRN

## 2013-09-04 MED ORDER — MEPERIDINE HCL 25 MG/ML IJ SOLN: 6.2500 mg | INTRAMUSCULAR | Status: DC | PRN

## 2013-09-04 MED ORDER — SCOPOLAMINE 1 MG/3DAYS TD PT72
MEDICATED_PATCH | TRANSDERMAL | Status: AC
  Filled 2013-09-04: qty 1

## 2013-09-04 MED ORDER — KETOROLAC TROMETHAMINE 30 MG/ML IJ SOLN: 15.0000 mg | Freq: Once | INTRAMUSCULAR | Status: DC | PRN

## 2013-09-04 MED ORDER — OXYTOCIN 40 UNITS IN LACTATED RINGERS INFUSION - SIMPLE MED: 62.5000 mL/h | INTRAVENOUS | Status: AC

## 2013-09-04 MED ORDER — CITRIC ACID-SODIUM CITRATE 334-500 MG/5ML PO SOLN
30.0000 mL | Freq: Once | ORAL | Status: DC
  Filled 2013-09-04: qty 15

## 2013-09-04 MED ORDER — METOCLOPRAMIDE HCL 5 MG/ML IJ SOLN: 10.0000 mg | Freq: Three times a day (TID) | INTRAMUSCULAR | Status: DC | PRN

## 2013-09-04 MED ORDER — ONDANSETRON HCL 4 MG/2ML IJ SOLN
INTRAMUSCULAR | Status: AC
  Filled 2013-09-04: qty 2

## 2013-09-04 MED ORDER — SODIUM CHLORIDE 0.9 % IR SOLN
Status: DC | PRN
Start: 2013-09-04 — End: 2013-09-04
  Administered 2013-09-04: 1000 mL

## 2013-09-04 MED ORDER — IBUPROFEN 600 MG PO TABS: 600.0000 mg | ORAL_TABLET | Freq: Four times a day (QID) | ORAL | Status: DC | PRN

## 2013-09-04 MED ORDER — KETOROLAC TROMETHAMINE 30 MG/ML IJ SOLN
30.0000 mg | Freq: Four times a day (QID) | INTRAMUSCULAR | Status: AC | PRN
  Administered 2013-09-04: 30 mg via INTRAMUSCULAR

## 2013-09-04 MED ORDER — ONDANSETRON HCL 4 MG/2ML IJ SOLN
4.0000 mg | INTRAMUSCULAR | Status: DC | PRN
  Administered 2013-09-05: 4 mg via INTRAVENOUS
  Filled 2013-09-04: qty 2

## 2013-09-04 MED ORDER — DIPHENHYDRAMINE HCL 25 MG PO CAPS: 25.0000 mg | ORAL_CAPSULE | Freq: Four times a day (QID) | ORAL | Status: DC | PRN

## 2013-09-04 MED ORDER — CEFAZOLIN SODIUM 1-5 GM-% IV SOLN
1.0000 g | Freq: Once | INTRAVENOUS | Status: DC
  Filled 2013-09-04: qty 50

## 2013-09-04 MED ORDER — MEPERIDINE HCL 25 MG/ML IJ SOLN
6.2500 mg | INTRAMUSCULAR | Status: DC | PRN
Start: 2013-09-04 — End: 2013-09-04

## 2013-09-04 MED ORDER — ONDANSETRON HCL 4 MG/2ML IJ SOLN
INTRAMUSCULAR | Status: DC | PRN
  Administered 2013-09-04: 4 mg via INTRAVENOUS

## 2013-09-04 MED ORDER — SIMETHICONE 80 MG PO CHEW: 80.0000 mg | CHEWABLE_TABLET | ORAL | Status: DC | PRN

## 2013-09-04 MED ORDER — DIPHENHYDRAMINE HCL 25 MG PO CAPS
25.0000 mg | ORAL_CAPSULE | ORAL | Status: DC | PRN
  Filled 2013-09-04: qty 1

## 2013-09-04 MED ORDER — PHENYLEPHRINE 8 MG IN D5W 100 ML (0.08MG/ML) PREMIX OPTIME
INJECTION | INTRAVENOUS | Status: DC | PRN
  Administered 2013-09-04: 60 ug/min via INTRAVENOUS

## 2013-09-04 MED ORDER — SENNOSIDES-DOCUSATE SODIUM 8.6-50 MG PO TABS
2.0000 | ORAL_TABLET | ORAL | Status: DC
  Administered 2013-09-04 – 2013-09-07 (×3): 2 via ORAL
  Filled 2013-09-04 (×2): qty 2

## 2013-09-04 MED ORDER — NALOXONE HCL 1 MG/ML IJ SOLN: 1.0000 ug/kg/h | INTRAVENOUS | Status: DC | PRN

## 2013-09-04 MED ORDER — PRENATAL MULTIVITAMIN CH
1.0000 | ORAL_TABLET | Freq: Every day | ORAL | Status: DC
  Administered 2013-09-05 – 2013-09-07 (×3): 1 via ORAL
  Filled 2013-09-04 (×3): qty 1

## 2013-09-04 MED ORDER — CEFAZOLIN SODIUM-DEXTROSE 2-3 GM-% IV SOLR
2.0000 g | INTRAVENOUS | Status: AC
  Administered 2013-09-04: 1 g via INTRAVENOUS
  Administered 2013-09-04: 2 g via INTRAVENOUS
  Filled 2013-09-04: qty 50

## 2013-09-04 MED ORDER — ONDANSETRON HCL 4 MG/2ML IJ SOLN: 4.0000 mg | Freq: Three times a day (TID) | INTRAMUSCULAR | Status: DC | PRN

## 2013-09-04 MED ORDER — MENTHOL 3 MG MT LOZG: 1.0000 | LOZENGE | OROMUCOSAL | Status: DC | PRN

## 2013-09-04 MED ORDER — LACTATED RINGERS IV SOLN
INTRAVENOUS | Status: DC
  Administered 2013-09-04 (×4): via INTRAVENOUS

## 2013-09-04 MED ORDER — TETANUS-DIPHTH-ACELL PERTUSSIS 5-2.5-18.5 LF-MCG/0.5 IM SUSP: 0.5000 mL | Freq: Once | INTRAMUSCULAR | Status: DC

## 2013-09-04 SURGICAL SUPPLY — 39 items
BARRIER ADHS 3X4 INTERCEED (GAUZE/BANDAGES/DRESSINGS) IMPLANT
BRR ADH 4X3 ABS CNTRL BYND (GAUZE/BANDAGES/DRESSINGS)
CLAMP CORD UMBIL (MISCELLANEOUS) IMPLANT
CLIP FILSHIE TUBAL LIGA STRL (Clip) ×3 IMPLANT
CLOTH BEACON ORANGE TIMEOUT ST (SAFETY) ×3 IMPLANT
CONTAINER PREFILL 10% NBF 15ML (MISCELLANEOUS) IMPLANT
DRAPE LG THREE QUARTER DISP (DRAPES) IMPLANT
DRSG OPSITE POSTOP 4X10 (GAUZE/BANDAGES/DRESSINGS) ×3 IMPLANT
DURAPREP 26ML APPLICATOR (WOUND CARE) ×3 IMPLANT
ELECT REM PT RETURN 9FT ADLT (ELECTROSURGICAL) ×3
ELECTRODE REM PT RTRN 9FT ADLT (ELECTROSURGICAL) ×1 IMPLANT
EXTRACTOR VACUUM KIWI (MISCELLANEOUS) IMPLANT
GLOVE BIO SURGEON STRL SZ 6.5 (GLOVE) ×2 IMPLANT
GLOVE BIO SURGEONS STRL SZ 6.5 (GLOVE) ×1
GOWN STRL REUS W/TWL LRG LVL3 (GOWN DISPOSABLE) ×6 IMPLANT
KIT ABG SYR 3ML LUER SLIP (SYRINGE) IMPLANT
NEEDLE HYPO 25X5/8 SAFETYGLIDE (NEEDLE) IMPLANT
NEEDLE SPNL 18GX3.5 QUINCKE PK (NEEDLE) ×3 IMPLANT
NS IRRIG 1000ML POUR BTL (IV SOLUTION) ×3 IMPLANT
PACK C SECTION WH (CUSTOM PROCEDURE TRAY) ×3 IMPLANT
PAD ABD 7.5X8 STRL (GAUZE/BANDAGES/DRESSINGS) ×3 IMPLANT
PAD OB MATERNITY 4.3X12.25 (PERSONAL CARE ITEMS) ×3 IMPLANT
SPONGE GAUZE 4X4 12PLY (GAUZE/BANDAGES/DRESSINGS) ×3 IMPLANT
SUT PDS AB 0 CTX 60 (SUTURE) IMPLANT
SUT VIC AB 0 CT1 27 (SUTURE)
SUT VIC AB 0 CT1 27XBRD ANBCTR (SUTURE) IMPLANT
SUT VIC AB 0 CT1 36 (SUTURE) IMPLANT
SUT VIC AB 2-0 CT1 27 (SUTURE) ×2
SUT VIC AB 2-0 CT1 TAPERPNT 27 (SUTURE) ×1 IMPLANT
SUT VIC AB 2-0 CTX 36 (SUTURE) ×6 IMPLANT
SUT VIC AB 3-0 CT1 27 (SUTURE) ×2
SUT VIC AB 3-0 CT1 TAPERPNT 27 (SUTURE) ×1 IMPLANT
SUT VIC AB 3-0 SH 27 (SUTURE)
SUT VIC AB 3-0 SH 27X BRD (SUTURE) IMPLANT
SYR 30ML LL (SYRINGE) ×3 IMPLANT
TAPE CLOTH SURG 4X10 WHT LF (GAUZE/BANDAGES/DRESSINGS) ×3 IMPLANT
TOWEL OR 17X24 6PK STRL BLUE (TOWEL DISPOSABLE) ×3 IMPLANT
TRAY FOLEY CATH 14FR (SET/KITS/TRAYS/PACK) ×3 IMPLANT
WATER STERILE IRR 1000ML POUR (IV SOLUTION) ×3 IMPLANT

## 2013-09-04 NOTE — MAU Note (Signed)
House coverage and nursery notified of 1600 posting for c/s

## 2013-09-04 NOTE — MAU Note (Addendum)
Contracting, contracting all night. Had to stop on the way here (she was driving).  Denies bleeding or leaking.

## 2013-09-04 NOTE — H&P (Signed)
LABOR ADMISSION HISTORY AND PHYSICAL  [name] [name] is a 33 y.o. female [phone] with IUP at 7128w0d by LMP c/w 22wk US presenting for contractions.  States has been contracting about once an hour since last night and have been becoming more painful.  She denies any LOF, VB. +FM.  Has had 2 prior c/s and has not been seen in the clinic since 33 weeks as missed several appointments.  No repeat cesarean was scheduled at that time of her 33 week visit. BTL papers signed 05/28/13.    [facility] since 21 wks. Has been very limited- only 4 visits.    Prenatal History/Complications:  Past Medical History: Past Medical History  Diagnosis Date  . Medical history non-contributory     Past Surgical History: Past Surgical History  Procedure Laterality Date  . Cesarean section    . Dilation and evacuation N/A 09/12/2012    Procedure: DILATATION AND EVACUATION;  Surgeon: Adam PhenixJames G Arnold, MD;  Location: [facility];  Service: Gynecology;  Laterality: N/A;  . Dilation and curettage of uterus      Obstetrical History: OB History   Grav Para Term Preterm Abortions TAB SAB Ect Mult Living   6 2 1 1 3 1 2  0 0 2     G1- SAB G2- preterm, LTCS for cord wrapped around the baby G3- term, LTCS, for LGA G4- TAB G5- SAB G6- current    Social History: History   Social History  . Marital Status: Single    Spouse Name: N/A    Number of Children: N/A  . Years of Education: N/A   Social History Main Topics  . Smoking status: Former Smoker    Types: Cigarettes  . Smokeless tobacco: Never Used  . Alcohol Use: No  . Drug Use: No     Comment: last used in February  . Sexual Activity: Yes    Birth Control/ Protection: None   Other Topics Concern  . None   Social History Narrative  . None    Family History: Family History  Problem Relation Age of Onset  . Diabetes Mother   . Hypertension Mother   . Hypertension Father   . Stroke Maternal Aunt   . Hearing loss Neg Hx     Allergies: No  Known Allergies  Prescriptions prior to admission  Medication Sig Dispense Refill  . Prenatal Vit-Fe Fumarate-FA (PRENATAL MULTIVITAMIN) TABS tablet Take 1 tablet by mouth daily at 12 noon.         Review of Systems   All systems reviewed and negative except as stated in HPI  Blood pressure 124/66, pulse 94, temperature 98.1 F (36.7 C), temperature source Oral, resp. rate 18, last menstrual period 11/28/2012, unknown if currently breastfeeding. General appearance: alert, cooperative and no distress Lungs: clear to auscultation bilaterally Heart: regular rate and rhythm Abdomen: soft, non-tender; bowel sounds normal Pelvic: 2/90/0 Extremities: Homans sign is negative, no sign of DVT  Presentation: cephalic Fetal monitoringBaseline: 135 bpm, Variability: Good {> 6 bpm), Accelerations: Reactive and Decelerations: Absent Uterine activity occasional       Prenatal labs: ABO, Rh: A/POS/-- (02/18 1607) Antibody: NEG (02/18 1607) Rubella:   RPR: NON REAC (04/21 1630)  HBsAg: NEGATIVE (02/18 1607)  HIV: NONREACTIVE (04/21 1630)  GBS:      Prenatal Transfer Tool  Maternal Diabetes: No Genetic Screening: Normal Maternal Ultrasounds/Referrals: Normal Fetal Ultrasounds or other Referrals:  None Maternal Substance Abuse:  Yes:  Type: Marijuana Significant Maternal Medications:  None Significant Maternal Lab Results: None     No results found for this or any previous visit (from the past 24 hour(s)).  Assessment: [name] [name] is a 33 y.o. [id number] at 2447w0d by LMP c/w 22 week US here for contractions.  Has had limited PNC and no visit since 33 weeks.   Did not have a repeat c/s scheduled despite 2 previous.  Will plan for repeat c/s today.    The risks of cesarean section discussed with the patient included but were not limited to: bleeding which may require transfusion or reoperation; infection which may require antibiotics; injury to bowel, bladder, ureters or other  surrounding organs; injury to the fetus; need for additional procedures including hysterectomy in the event of a life-threatening hemorrhage; placental abnormalities wth subsequent pregnancies, incisional problems, thromboembolic phenomenon and other postoperative/anesthesia complications. The patient concurred with the proposed plan, giving informed written consent for the procedure.   Patient has been NPO since midnight except for a bag of chips at 1pm  she will remain NPO for procedure. Anesthesia and OR aware. Anesthesia plan for OR at 4pm.  Preoperative prophylactic antibiotics and SCDs ordered on call to the OR.  To OR when ready.  Patient desires permanent sterilization.  Other reversible forms of contraception were discussed with patient; she declines all other modalities. Risks of procedure discussed with patient including but not limited to: risk of regret, permanence of method, bleeding, infection, injury to surrounding organs and need for additional procedures.  Failure risk of 1-2 % with increased risk of ectopic gestation if pregnancy occurs was also discussed with patient.  Patient verbalized understanding of these risks and wants to proceed with sterilization.  Written informed consent obtained.  To OR when ready.   BECK, KELI L 09/04/2013, 2:33 PM

## 2013-09-04 NOTE — MAU Note (Signed)
Contracting but they are not regular.  Today is due date.  Missed last appt. Next appt is next week on Mon.

## 2013-09-04 NOTE — Op Note (Addendum)
09/04/2013  5:16 PM  PATIENT:  [name] [name]  33 y.o. female  PRE-OPERATIVE DIAGNOSIS:  [redacted] weeks EGA, previous c/s x2, labor, desire for permanent sterility, morbid obesity  POST-OPERATIVE DIAGNOSIS:  same  PROCEDURE:  REPEAT LOW TRANSVERSE CESAREAN SECTION, BILATERAL TUBAL LIGATION  SURGEON:  Surgeon(s) and Role:    * [name] BossierMyra C Walden Statz, MD - Primary  PHYSICIAN ASSISTANT:   ASSISTANTS: Rulon AbideKeli Beck, MD   FINDINGS: living female infant, weight and Apgars are pending, normal intact placenta, normal adnexa ANESTHESIA:   spinal  EBL:  Total I/O In: 1300 [I.V.:1300] Out: 750 [Urine:150; Blood:600]  BLOOD ADMINISTERED:none  DRAINS: none   LOCAL MEDICATIONS USED:  MARCAINE     SPECIMEN:  Source of Specimen:  cord blood  DISPOSITION OF SPECIMEN:  PATHOLOGY  COUNTS:  YES  TOURNIQUET:  * No tourniquets in log *  DICTATION: .Dragon Dictation  PLAN OF CARE: Admit to inpatient   PATIENT DISPOSITION:  PACU - hemodynamically stable.   Delay start of Pharmacological VTE agent (>24hrs) due to surgical blood loss or risk of bleeding: not applicable  The risks, benefits, and alternatives of surgery were explained, understood, accepted. Consents were signed. All questions were answered. In the operating room spinal anesthesia was applied without complication. Her abdomen and vagina were prepped and draped in the usual sterile fashion. A Foley catheter was placed, draining clear urine throughout case. Timeout procedure was done. After adequate anesthesia was assured 30 mL for 0.5% Marcaine was injected into the subcutaneous tissue at the site of her previous cesarean. An incision was made through the previous incision. The incision was carried down through the subcutaneous tissue to the fascia. The fascia was scored the midline and extended bilaterally. The middle 20% of the rectus muscles were separated in a transverse fashion using electrosurgical technique. Excellent hemostasis was maintained.  The peritoneum was entered with hemostats. Peritoneal incision was extended bilaterally with the Bovie. The bladder blade was placed. A transverse incision was made on the well-developed lower uterine segment. The uterine incision was extended with traction on each side. Amniotomy was performed with a hemostat. Clear fluid was noted. The baby was delivered from a vertex presentation.the mouth and nostrils were suctioned prior to delivery of the shoulders.  The baby's cord was clamped and cut and was transferred to the NICU personnel for routine care. The placenta was delivered intact with traction. The uterus was left in situ and the interior was cleaned with a dry lap sponge. The uterine incision was closed with 2-0 Vicryl running locking suture. Excellent hemostasis was noted. By tilting the uterus each side was able to visualize the adnexa, and they were normal. I placed a Fische clip across the entire oviduct in the isthmic region of each tube. The rectus fascia rectus muscles were noted be hemostatic as well. The fascia was closed with a #1 PDS loop in a running nonlocking fashion. No defects were palpable. The subcutaneous tissue was irrigated, clean, and dried. A subcuticular closure was done with a 3-0 Vicryl suture. Steri-Strips are placed. Excellent cosmetic results were obtained. She was taken to the recovery room in stable condition. She tolerated the procedure well.

## 2013-09-04 NOTE — Anesthesia Preprocedure Evaluation (Addendum)
Anesthesia Evaluation  Patient identified by MRN, date of birth, ID band Patient awake    Reviewed: Allergy & Precautions, H&P , NPO status , Patient's Chart, lab work & pertinent test results  Airway Mallampati: II TM Distance: >3 FB Neck ROM: full    Dental no notable dental hx.    Pulmonary neg pulmonary ROS, former smoker,    Pulmonary exam normal       Cardiovascular negative cardio ROS      Neuro/Psych negative neurological ROS  negative psych ROS   GI/Hepatic negative GI ROS, Neg liver ROS,   Endo/Other  Morbid obesity  Renal/GU negative Renal ROS     Musculoskeletal   Abdominal (+) + obese,   Peds  Hematology negative hematology ROS (+)   Anesthesia Other Findings   Reproductive/Obstetrics (+) Pregnancy                          Anesthesia Physical Anesthesia Plan  ASA: III  Anesthesia Plan: Spinal   Post-op Pain Management:    Induction:   Airway Management Planned:   Additional Equipment:   Intra-op Plan:   Post-operative Plan:   Informed Consent: I have reviewed the patients History and Physical, chart, labs and discussed the procedure including the risks, benefits and alternatives for the proposed anesthesia with the patient or authorized representative who has indicated his/her understanding and acceptance.     Plan Discussed with: CRNA and Surgeon  Anesthesia Plan Comments:        Anesthesia Quick Evaluation

## 2013-09-04 NOTE — Anesthesia Postprocedure Evaluation (Signed)
Anesthesia Post Note  Patient: [name] [name]  Procedure(s) Performed: Procedure(s) (LRB): CESAREAN SECTION (N/A)  Anesthesia type: Spinal  Patient location: PACU  Post pain: Pain level controlled  Post assessment: Post-op Vital signs reviewed  Last Vitals:  Filed Vitals:   09/04/13 1845  BP: 107/62  Pulse: 75  Temp:   Resp: 14    Post vital signs: Reviewed  Level of consciousness: awake  Complications: No apparent anesthesia complications

## 2013-09-04 NOTE — Anesthesia Procedure Notes (Signed)
Spinal  Patient location during procedure: OR Start time: 09/04/2013 4:38 PM End time: 09/04/2013 4:40 PM Staffing Anesthesiologist: Leilani AbleHATCHETT, Jihan Mellette Performed by: anesthesiologist  Preanesthetic Checklist Completed: patient identified, surgical consent, pre-op evaluation, timeout performed, IV checked, risks and benefits discussed and monitors and equipment checked Spinal Block Patient position: sitting Prep: DuraPrep Patient monitoring: heart rate, cardiac monitor, continuous pulse ox and blood pressure Approach: midline Location: L3-4 Injection technique: single-shot Needle Needle type: Pencan  Needle gauge: 24 G Needle length: 9 cm Needle insertion depth: 7 cm Assessment Sensory level: T4

## 2013-09-04 NOTE — Addendum Note (Signed)
Addendum created 09/04/13 1855 by Leilani AbleFranklin Lavilla Delamora, MD   Modules edited: Anesthesia Attestations

## 2013-09-04 NOTE — Transfer of Care (Signed)
Immediate Anesthesia Transfer of Care Note  Patient: [name] BrighamOriel W Bostrom  Procedure(s) Performed: Procedure(s): CESAREAN SECTION (N/A)  Patient Location: PACU  Anesthesia Type:Spinal  Level of Consciousness: awake  Airway & Oxygen Therapy: Patient Spontanous Breathing  Post-op Assessment: Report given to PACU RN  Post vital signs: Reviewed and stable  Complications: No apparent anesthesia complications

## 2013-09-05 ENCOUNTER — Encounter (HOSPITAL_COMMUNITY): Payer: Self-pay | Admitting: Obstetrics & Gynecology

## 2013-09-05 LAB — CBC
HCT: 27.5 % — ABNORMAL LOW (ref 36.0–46.0)
Hemoglobin: 9.2 g/dL — ABNORMAL LOW (ref 12.0–15.0)
MCH: 30.4 pg (ref 26.0–34.0)
MCHC: 33.5 g/dL (ref 30.0–36.0)
MCV: 90.8 fL (ref 78.0–100.0)
PLATELETS: 255 10*3/uL (ref 150–400)
RBC: 3.03 MIL/uL — AB (ref 3.87–5.11)
RDW: 14.1 % (ref 11.5–15.5)
WBC: 9.6 10*3/uL (ref 4.0–10.5)

## 2013-09-05 LAB — BIRTH TISSUE RECOVERY COLLECTION (PLACENTA DONATION)

## 2013-09-05 NOTE — Progress Notes (Signed)
Clinical Social Work Department PSYCHOSOCIAL ASSESSMENT - MATERNAL/CHILD 09/05/2013  Patient:  [name] [name], [name] [name]  Account Number:  [id number]  Admit Date:  09/04/2013  Ardine Eng Name:   Izell Berlin    Clinical Social Worker:  Terri Piedra, LCSW   Date/Time:  09/05/2013 12:00 N  Date Referred:  09/05/2013   Referral source  CN     Referred reason  [facility]  Substance Abuse  Substance Abuse   Other referral source:    I:  FAMILY / HOME ENVIRONMENT Child's legal guardian:  PARENT  Guardian - Name Guardian - Age Guardian - Address  Amerah Puleo [address] [place], [place], [place] [address]  Lisette Abu     Other household support members/support persons Name Relationship DOB   SON 8   DAUGHTER 3   Other support:   MOB states she has a good support system.  She had numerous visitors with her during CSW's visit.    II  PSYCHOSOCIAL DATA Information Source:  Patient Interview  Occupational hygienist Employment:   MOB works at [employer] and will have 6 weeks of maternity leave.  FOB works at [employer].   Financial resources:  Medicaid If Medicaid - County:  [place] Other  [place] / Grade:   Maternity Care Coordinator / Child Services Coordination / Early Interventions:  Cultural issues impacting care:   None stated    III  STRENGTHS Strengths  Adequate Resources  Home prepared for Child (including basic supplies)  Supportive family/friends   Strength comment:    IV  RISK FACTORS AND CURRENT PROBLEMS Current Problem:  None   Risk Factor & Current Problem Patient Issue Family Issue Risk Factor / Current Problem Comment   N N     V  SOCIAL WORK ASSESSMENT  CSW met with MOB in her first floor room/127 to complete assessment for hx of marijuana use and limited PNC.  MOB was very pleasant and welcoming of CSW's visit.  Her family had just arrived to meet baby, so CSW offered to return at a later time.  MOB stated CSW could  talk with her now and gave permission to speak openly in front of family.  Family was not involved in the conversation.  MOB states she and baby are doing well.  She and FOB are in a relationship.  This is their first child together.  She reports having everything she needs for baby at home.  MOB has an 25 and 82 year old, who have supportive fathers, but live with her.  CSW asked MOB about her [facility] course and MOB states she was working as much as possible, often double shifts, in order to save money for maternity leave, since it will be unpaid.  She stated understanding when CSW explained hospital drug screen policy.  She denies all drug use and states no concers with baby being tested.  CSW explained that baby's UDS is negative.  MOB denies any hx of PPD after her other two children.  CSW reviewed signs and symptoms and asked her to contact her doctor if she has concerns at any time.  She agreed and states no questions, concerns or needs for CSW at this time.  She thanked CSW for the visit.   VI SOCIAL WORK PLAN Social Work Plan  No Further Intervention Required / No Barriers to Discharge   Type of pt/family education:   Hospital drug screen policy  PPD signs and symptoms   If  child protective services report - county:   If child protective services report - date:   Information/referral to community resources comment:   No referral needs noted at this time.   Other social work plan:   CSW will monitor MDS result

## 2013-09-05 NOTE — Progress Notes (Signed)
UR chart review completed.  

## 2013-09-05 NOTE — Progress Notes (Signed)
Subjective: Postpartum Day 1: Cesarean Delivery Patient reports that she feels fine. She felt nausea last night, but not this morning.  Objective: Vital signs in last 24 hours: Temp:  [97.1 F (36.2 C)-98.2 F (36.8 C)] 97.9 F (36.6 C) (07/01 0530) Pulse Rate:  [68-96] 88 (07/01 0530) Resp:  [14-23] 18 (07/01 0530) BP: (94-140)/(44-76) 97/63 mmHg (07/01 0530) SpO2:  [94 %-100 %] 96 % (07/01 0530) Weight:  [117.845 kg (259 lb 12.8 oz)] 117.845 kg (259 lb 12.8 oz) (06/30 1509)  Physical Exam:  General: alert Lochia: appropriate Uterine Fundus: firm Incision: dressing c/d/i DVT Evaluation: No evidence of DVT seen on physical exam.   Recent Labs  09/04/13 1425 09/05/13 0610  HGB 10.6* 9.2*  HCT 32.2* 27.5*    Assessment/Plan: Status post Cesarean section. Doing well postoperatively.  Continue current care.  Fount Bahe C. 09/05/2013, 7:50 AM

## 2013-09-06 NOTE — Progress Notes (Signed)
Subjective: Postpartum Day 2: Cesarean Delivery Patient reports incisional pain, + flatus and no problems voiding.  Breast and bottlefeeding.  Desires discharge home tomorrow.    Objective: Vital signs in last 24 hours: Temp:  [98.1 F (36.7 C)-98.3 F (36.8 C)] 98.3 F (36.8 C) (07/02 0607) Pulse Rate:  [80-114] 85 (07/02 0607) Resp:  [16-18] 18 (07/02 0607) BP: (83-110)/(50-71) 108/71 mmHg (07/02 0607) SpO2:  [95 %-96 %] 95 % (07/01 1730)  Physical Exam:  General: alert, cooperative and appears stated age CVS:  RRR, without murmur, gallops, or rubs Lungs:  CTA bilat ABD:  +BSx4, normal Lochia: appropriate Uterine Fundus: firm Incision: no significant drainage, dressing clean, dry, and intact DVT Evaluation: No evidence of DVT seen on physical exam. Negative Homan's sign.  Recent Labs  09/04/13 1425 09/05/13 0610  HGB 10.6* 9.2*  HCT 32.2* 27.5*    Assessment/Plan: Status post Cesarean section. Doing well postoperatively.  Discharge home tomorrow.  [facility],WALIDAH 09/06/2013, 6:54 AM

## 2013-09-07 MED ORDER — IBUPROFEN 600 MG PO TABS: 600.0000 mg | ORAL_TABLET | Freq: Four times a day (QID) | ORAL | Status: DC

## 2013-09-07 MED ORDER — OXYCODONE-ACETAMINOPHEN 5-325 MG PO TABS: 1.0000 | ORAL_TABLET | ORAL | Status: DC | PRN

## 2013-09-07 NOTE — Discharge Instructions (Signed)
Cesarean Delivery °Care After °Refer to this sheet in the next few weeks. These instructions provide you with information on caring for yourself after your procedure. Your health care provider may also give you specific instructions. Your treatment has been planned according to current medical practices, but problems sometimes occur. Call your health care provider if you have any problems or questions after you go home. °HOME CARE INSTRUCTIONS  °· Only take over-the-counter or prescription medications as directed by your health care provider. °· Do not drink alcohol, especially if you are breastfeeding or taking medication to relieve pain. °· Do not chew or smoke tobacco. °· Continue to use good perineal care. Good perineal care includes: °¨ Wiping your perineum from front to back. °¨ Keeping your perineum clean. °· Check your surgical cut (incision) daily for increased redness, drainage, swelling, or separation of skin. °· Clean your incision gently with soap and water every day, and then pat it dry. If your health care provider says it is OK, leave the incision uncovered. Use a bandage (dressing) if the incision is draining fluid or appears irritated. If the adhesive strips across the incision do not fall off within 7 days, carefully peel them off. °· Hug a pillow when coughing or sneezing until your incision is healed. This helps to relieve pain. °· Do not use tampons or douche until your health care provider says it is okay. °· Shower, wash your hair, and take tub baths as directed by your health care provider. °· Wear a well-fitting bra that provides breast support. °· Limit wearing support panties or control-top hose. °· Drink enough fluids to keep your urine clear or pale yellow. °· Eat high-fiber foods such as whole grain cereals and breads, brown rice, beans, and fresh fruits and vegetables every day. These foods may help prevent or relieve constipation. °· Resume activities such as climbing stairs,  driving, lifting, exercising, or traveling as directed by your health care provider. °· Talk to your health care provider about resuming sexual activities. This is dependent upon your risk of infection, your rate of healing, and your comfort and desire to resume sexual activity. °· Try to have someone help you with your household activities and your newborn for at least a few days after you leave the hospital. °· Rest as much as possible. Try to rest or take a nap when your newborn is sleeping. °· Increase your activities gradually. °· Keep all of your scheduled postpartum appointments. It is very important to keep your scheduled follow-up appointments. At these appointments, your health care provider will be checking to make sure that you are healing physically and emotionally. °SEEK MEDICAL CARE IF:  °· You are passing large clots from your vagina. Save any clots to show your health care provider. °· You have a foul smelling discharge from your vagina. °· You have trouble urinating. °· You are urinating frequently. °· You have pain when you urinate. °· You have a change in your bowel movements. °· You have increasing redness, pain, or swelling near your incision. °· You have pus draining from your incision. °· Your incision is separating. °· You have painful, hard, or reddened breasts. °· You have a severe headache. °· You have blurred vision or see spots. °· You feel sad or depressed. °· You have thoughts of hurting yourself or your newborn. °· You have questions about your care, the care of your newborn, or medications. °· You are dizzy or lightheaded. °· You have a rash. °· You   have pain, redness, or swelling at the site of the removed intravenous access (IV) tube. °· You have nausea or vomiting. °· You stopped breastfeeding and have not had a menstrual period within 12 weeks of stopping. °· You are not breastfeeding and have not had a menstrual period within 12 weeks of delivery. °· You have a fever. °SEEK  IMMEDIATE MEDICAL CARE IF: °· You have persistent pain. °· You have chest pain. °· You have shortness of breath. °· You faint. °· You have leg pain. °· You have stomach pain. °· Your vaginal bleeding saturates 2 or more sanitary pads in 1 hour. °MAKE SURE YOU:  °· Understand these instructions. °· Will watch your condition. °· Will get help right away if you are not doing well or get worse. °Document Released: 11/14/2001 Document Revised: 10/25/2012 Document Reviewed: 10/20/2011 °Exit[employer]® Patient Information ©2015 [employer]. This information is not intended to replace advice given to you by your health care provider. Make sure you discuss any questions you have with your health care provider. ° °

## 2013-09-07 NOTE — Discharge Summary (Signed)
Attestation of Attending Supervision of Advanced Practitioner (CNM/NP): Evaluation and management procedures were performed by the Advanced Practitioner under my supervision and collaboration.  I have reviewed the Advanced Practitioner's note and chart, and I agree with the management and plan.  Ayen Viviano 09/07/2013 10:24 AM   

## 2013-09-07 NOTE — Discharge Summary (Signed)
Obstetric Discharge Summary Reason for Admission: onset of labor and cesarean section/BTL Prenatal Procedures: NST Intrapartum Procedures: RLTCS Postpartum Procedures: none Complications-Operative and Postpartum: none Hemoglobin  Date Value Ref Range Status  09/05/2013 9.2* 12.0 - 15.0 g/dL Final     HCT  Date Value Ref Range Status  09/05/2013 27.5* 36.0 - 46.0 % Final   Hospital Course: Patient is 33 y/o F [id number] who presented for onset of contractions consistent with active labor. She elected to undergo rLTCS with concommitant BTL. Her operative course was uncomplicated. Postoperatively her pain was controlled successfully with pain medicine regimen. She did not experience any complications. She remains stable, and is now ready for discharge.   Physical Exam:  General: alert, cooperative and no distress Lochia: appropriate Uterine Fundus: firm Incision: healing well, no significant drainage, no dehiscence DVT Evaluation: No evidence of DVT seen on physical exam. No cords or calf tenderness.  Discharge Diagnoses: Term Pregnancy-delivered  Discharge Information: Date: 09/07/2013 Activity: pelvic rest, limited weight bearing to 25lb.  Diet: routine Medications: PNV, Ibuprofen and Percocet Condition: stable Instructions: refer to practice specific booklet Discharge to: home Follow-up Information   [facility] (Low Risk)    Specialty:  Obstetrics and Gynecology   Contact information:   [address] [place] [address] [phone]      Newborn Data: Live born female  Birth Weight: 7 lb 10.9 oz (3485 g) APGAR: 8, 9  Home with mother.  Melancon, Hillery HunterCaleb G 09/07/2013, 9:08 AM  I spoke with and examined patient and agree with resident's note and plan of care.  Tawana ScaleMichael Ryan Corday Wyka, MD OB Fellow 09/07/2013 9:15 AM

## 2013-09-10 ENCOUNTER — Encounter: Payer: Medicaid Other | Admitting: Family Medicine

## 2013-10-08 ENCOUNTER — Telehealth: Payer: Self-pay

## 2013-10-08 ENCOUNTER — Ambulatory Visit: Payer: Medicaid Other | Admitting: Family Medicine

## 2013-10-08 NOTE — Telephone Encounter (Signed)
Patient missed today's PP appointment. Attempted to call patient. No answer. Left message stating we are sorry you missed your appointment, please call clinic to reschedule.

## 2013-10-25 ENCOUNTER — Encounter: Payer: Self-pay | Admitting: *Deleted

## 2013-11-07 ENCOUNTER — Emergency Department (HOSPITAL_COMMUNITY)
Admission: EM | Admit: 2013-11-07 | Discharge: 2013-11-07 | Disposition: A | Discharge status: Home / Self Care | Payer: Medicaid Other | Attending: Emergency Medicine | Admitting: Emergency Medicine

## 2013-11-07 ENCOUNTER — Encounter (HOSPITAL_COMMUNITY): Payer: Self-pay | Admitting: Emergency Medicine

## 2013-11-07 DIAGNOSIS — A499 Bacterial infection, unspecified: Secondary | ICD-10-CM | POA: Insufficient documentation

## 2013-11-07 DIAGNOSIS — B9689 Other specified bacterial agents as the cause of diseases classified elsewhere: Secondary | ICD-10-CM | POA: Diagnosis not present

## 2013-11-07 DIAGNOSIS — Z3202 Encounter for pregnancy test, result negative: Secondary | ICD-10-CM | POA: Insufficient documentation

## 2013-11-07 DIAGNOSIS — Z87891 Personal history of nicotine dependence: Secondary | ICD-10-CM | POA: Diagnosis not present

## 2013-11-07 DIAGNOSIS — Z792 Long term (current) use of antibiotics: Secondary | ICD-10-CM | POA: Insufficient documentation

## 2013-11-07 DIAGNOSIS — Z79899 Other long term (current) drug therapy: Secondary | ICD-10-CM | POA: Diagnosis not present

## 2013-11-07 DIAGNOSIS — Z202 Contact with and (suspected) exposure to infections with a predominantly sexual mode of transmission: Secondary | ICD-10-CM | POA: Diagnosis not present

## 2013-11-07 DIAGNOSIS — N760 Acute vaginitis: Secondary | ICD-10-CM | POA: Diagnosis not present

## 2013-11-07 LAB — WET PREP, GENITAL
Trich, Wet Prep: NONE SEEN
WBC, Wet Prep HPF POC: NONE SEEN
YEAST WET PREP: NONE SEEN

## 2013-11-07 LAB — POC URINE PREG, ED: Preg Test, Ur: NEGATIVE

## 2013-11-07 MED ORDER — CEFTRIAXONE SODIUM 250 MG IJ SOLR
250.0000 mg | Freq: Once | INTRAMUSCULAR | Status: AC
  Administered 2013-11-07: 250 mg via INTRAMUSCULAR
  Filled 2013-11-07: qty 250

## 2013-11-07 MED ORDER — METRONIDAZOLE 500 MG PO TABS
500.0000 mg | ORAL_TABLET | Freq: Once | ORAL | Status: AC
  Administered 2013-11-07: 500 mg via ORAL
  Filled 2013-11-07: qty 1

## 2013-11-07 MED ORDER — METRONIDAZOLE 500 MG PO TABS: 500.0000 mg | ORAL_TABLET | Freq: Two times a day (BID) | ORAL | Status: DC

## 2013-11-07 MED ORDER — AZITHROMYCIN 250 MG PO TABS
1000.0000 mg | ORAL_TABLET | Freq: Once | ORAL | Status: AC
  Administered 2013-11-07: 1000 mg via ORAL
  Filled 2013-11-07: qty 4

## 2013-11-07 NOTE — ED Notes (Signed)
Dr Lozier at bedside  

## 2013-11-07 NOTE — ED Provider Notes (Signed)
CSN: [id number]     Arrival date & time 11/07/13  1744 History   First MD Initiated Contact with Patient 11/07/13 1901     Chief Complaint  Patient presents with  . Exposure to STD     (Consider location/radiation/quality/duration/timing/severity/associated sxs/prior Treatment) HPI [name] [name] 33 y.o. with a pmh of STI presents for concern of exposure to chlamydia. Her boyfriend was diagnosed with chlamydia recently. She took reportedly 500 mg Keflex BID for 7 days from a friend for this. She reports in the past 2-3 days she started having foul smelling vaginal dc. This is moderate in severity. No known exacerbating or relieving factors. No dysuria, hematuria, abdominal pain, diarrhea, blood in her stool, melena.   Past Medical History  Diagnosis Date  . Medical history non-contributory    Past Surgical History  Procedure Laterality Date  . Cesarean section    . Dilation and evacuation N/A 09/12/2012    Procedure: DILATATION AND EVACUATION;  Surgeon: Adam Phenix, MD;  Location: [facility];  Service: Gynecology;  Laterality: N/A;  . Dilation and curettage of uterus    . Cesarean section N/A 09/04/2013    Procedure: CESAREAN SECTION;  Surgeon: Allie Bossier, MD;  Location: [facility];  Service: Obstetrics;  Laterality: N/A;   Family History  Problem Relation Age of Onset  . Diabetes Mother   . Hypertension Mother   . Hypertension Father   . Stroke Maternal Aunt   . Hearing loss Neg Hx    History  Substance Use Topics  . Smoking status: Former Smoker    Types: Cigarettes  . Smokeless tobacco: Never Used  . Alcohol Use: No   OB History   Grav Para Term Preterm Abortions TAB SAB Ect Mult Living   0 0 3     Review of Systems  All other systems reviewed and are negative.     Allergies  Review of patient's allergies indicates no known allergies.  Home Medications   Prior to Admission medications   Medication Sig Start Date End Date Taking? Authorizing Provider   Prenatal Vit-Fe Fumarate-FA (PRENATAL MULTIVITAMIN) TABS tablet Take 1 tablet by mouth daily at 12 noon.   Yes Historical Provider, MD  metroNIDAZOLE (FLAGYL) 500 MG tablet Take 1 tablet (500 mg total) by mouth 2 (two) times daily. 11/07/13   Sena Hitch, MD   BP 137/78  Pulse 86  Temp(Src) 98.4 F (36.9 C) (Oral)  Resp 18  SpO2 100% Physical Exam  Constitutional: She is oriented to person, place, and time. She appears well-developed and well-nourished. No distress.  HENT:  Head: Normocephalic and atraumatic.  Right Ear: External ear normal.  Left Ear: External ear normal.  Eyes: Conjunctivae and EOM are normal. Right eye exhibits no discharge. Left eye exhibits no discharge.  Neck: Normal range of motion. Neck supple. No JVD present.  Cardiovascular: Normal rate, regular rhythm and normal heart sounds.  Exam reveals no gallop and no friction rub.   No murmur heard. Pulmonary/Chest: Effort normal and breath sounds normal. No stridor. No respiratory distress. She has no wheezes. She has no rales. She exhibits no tenderness.  Abdominal: Soft. Bowel sounds are normal. She exhibits no distension. There is no tenderness. There is no rebound and no guarding.  Genitourinary: Uterus is not deviated and not tender. Cervix exhibits discharge (white). Cervix exhibits no motion tenderness and no friability. Right adnexum displays no mass, no tenderness and no fullness. Left adnexum  displays no mass, no tenderness and no fullness.  Musculoskeletal: Normal range of motion. She exhibits no edema.  Neurological: She is alert and oriented to person, place, and time.  Skin: Skin is warm. No rash noted. She is not diaphoretic.  Psychiatric: She has a normal mood and affect. Her behavior is normal.    ED Course  Procedures (including critical care time) Labs Review Labs Reviewed  WET PREP, GENITAL - Abnormal; Notable for the following:    Clue Cells Wet Prep HPF POC FEW (*)    All other components  within normal limits  GC/CHLAMYDIA PROBE AMP  POC URINE PREG, ED    Imaging Review No results found.   EKG Interpretation None      MDM   Final diagnoses:  STD exposure  Bacterial vaginosis    Pt presents for STI exposure and Vaginal dc with malodor. + partner with chlamydia. No abd pain, dysuria, diarrhea, N/V. Exam not concerning for PID or cervicitis. Will empirically with 1 gm azithromycin and 250 mg Rocephin. + BV on wet prep. No Trich. Gave 500 mg Flagyl in the ED and Rx for 500 mg BID for 2 weeks. Gave STI precautions and education. All partners should be treated. Strong return precautions given for worsening symptoms or any other alarming or concerning symptoms or issues. The patient was in agreement with the treatment plan and I answered all of their questions. The patient was stable for dc. At dc, the patient ambulated without difficulty, was moving all four extremities, symptoms improved, NAD. and AOx4 Care discussed with my attending, Dr. Eber Hong. If performed and available, imaging studies and labs reviewed.                   Sena Hitch, MD 11/07/13 2003

## 2013-11-07 NOTE — Discharge Instructions (Signed)
Bacterial Vaginosis Bacterial vaginosis is a vaginal infection that occurs when the normal balance of bacteria in the vagina is disrupted. It results from an overgrowth of certain bacteria. This is the most common vaginal infection in women of childbearing age. Treatment is important to prevent complications, especially in pregnant women, as it can cause a premature delivery. CAUSES  Bacterial vaginosis is caused by an increase in harmful bacteria that are normally present in smaller amounts in the vagina. Several different kinds of bacteria can cause bacterial vaginosis. However, the reason that the condition develops is not fully understood. RISK FACTORS Certain activities or behaviors can put you at an increased risk of developing bacterial vaginosis, including:  Having a new sex partner or multiple sex partners.  Douching.  Using an intrauterine device (IUD) for contraception. Women do not get bacterial vaginosis from toilet seats, bedding, swimming pools, or contact with objects around them. SIGNS AND SYMPTOMS  Some women with bacterial vaginosis have no signs or symptoms. Common symptoms include:  Grey vaginal discharge.  A fishlike odor with discharge, especially after sexual intercourse.  Itching or burning of the vagina and vulva.  Burning or pain with urination. DIAGNOSIS  Your health care provider will take a medical history and examine the vagina for signs of bacterial vaginosis. A sample of vaginal fluid may be taken. Your health care provider will look at this sample under a microscope to check for bacteria and abnormal cells. A vaginal pH test may also be done.  TREATMENT  Bacterial vaginosis may be treated with antibiotic medicines. These may be given in the form of a pill or a vaginal cream. A second round of antibiotics may be prescribed if the condition comes back after treatment.  HOME CARE INSTRUCTIONS   Only take over-the-counter or prescription medicines as  directed by your health care provider.  If antibiotic medicine was prescribed, take it as directed. Make sure you finish it even if you start to feel better.  Do not have sex until treatment is completed.  Tell all sexual partners that you have a vaginal infection. They should see their health care provider and be treated if they have problems, such as a mild rash or itching.  Practice safe sex by using condoms and only having one sex partner. SEEK MEDICAL CARE IF:   Your symptoms are not improving after 3 days of treatment.  You have increased discharge or pain.  You have a fever. MAKE SURE YOU:   Understand these instructions.  Will watch your condition.  Will get help right away if you are not doing well or get worse. FOR MORE INFORMATION  Centers for Disease Control and Prevention, Division of STD Prevention: [employer]s.[employer] American Sexual Health Association (ASHA): www.ashastd.org  Document Released: 02/22/2005 Document Revised: 12/13/2012 Document Reviewed: 10/04/2012 [facility] Patient Information 2015 [place], [place]. This information is not intended to replace advice given to you by your health care provider. Make sure you discuss any questions you have with your health care provider.  Antibiotic Medication Antibiotic medicine helps fight germs. Germs cause infections. This type of medicine will not work for colds, flu, or other viral infections. Tell your doctor if you:  Are allergic to any medicines.  Are pregnant or are trying to get pregnant.  Are taking other medicines.  Have other medical problems. HOME CARE  Take your medicine with a glass of water or food as told by your doctor.  Take the medicine as told. Finish them even if you start  to feel better.  Do not give your medicine to other people.  Do not use your medicine in the future for a different infection.  Ask your doctor about which side effects to watch for.  Try not to miss any doses. If you  miss a dose, take it as soon as possible. If it is almost time for your next dose, and your dosing schedule is:  Two doses a day, take the missed dose and the next dose 5 to 6 hours later.  Three or more doses a day, take the missed dose and the next dose 2 to 4 hours later, or double your next dose.  Then go back to your normal schedule. GET HELP RIGHT AWAY IF:   You get worse or do not get better within a few days.  The medicine makes you sick.  You develop a rash or any other side effects.  You have questions or concerns. MAKE SURE YOU:  Understand these instructions.  Will watch your condition.  Will get help right away if you are not doing well or get worse. Document Released: 12/02/2007 Document Revised: 05/17/2011 Document Reviewed: 01/28/2009 [facility] Patient Information 2015 [place], [place]. This information is not intended to replace advice given to you by your health care provider. Make sure you discuss any questions you have with your health care provider.

## 2013-11-07 NOTE — ED Notes (Signed)
Pt in stating she received a call that she had been exposed to an STD, c/o vaginal odor, denies pain, no distress noted

## 2013-11-08 LAB — GC/CHLAMYDIA PROBE AMP
CT Probe RNA: NEGATIVE
GC PROBE AMP APTIMA: NEGATIVE

## 2013-11-08 NOTE — ED Provider Notes (Signed)
I saw and evaluated the patient, reviewed the resident's note and I agree with the findings and plan.  Pertinent History: STD exposure Pertinent Exam findings: soft abd - pelvic deferred as done by resident    Vida Roller, MD 11/08/13 1436

## 2014-01-07 ENCOUNTER — Encounter (HOSPITAL_COMMUNITY): Payer: Self-pay | Admitting: Emergency Medicine

## 2014-01-26 ENCOUNTER — Encounter (HOSPITAL_COMMUNITY): Payer: Self-pay | Admitting: Emergency Medicine

## 2014-01-26 ENCOUNTER — Emergency Department (HOSPITAL_COMMUNITY)
Admission: EM | Admit: 2014-01-26 | Discharge: 2014-01-26 | Disposition: A | Discharge status: Home / Self Care | Payer: Medicaid Other | Attending: Emergency Medicine | Admitting: Emergency Medicine

## 2014-01-26 DIAGNOSIS — Z87891 Personal history of nicotine dependence: Secondary | ICD-10-CM | POA: Diagnosis not present

## 2014-01-26 DIAGNOSIS — R000 Tachycardia, unspecified: Secondary | ICD-10-CM | POA: Insufficient documentation

## 2014-01-26 DIAGNOSIS — N898 Other specified noninflammatory disorders of vagina: Secondary | ICD-10-CM | POA: Diagnosis present

## 2014-01-26 DIAGNOSIS — B9689 Other specified bacterial agents as the cause of diseases classified elsewhere: Secondary | ICD-10-CM

## 2014-01-26 DIAGNOSIS — Z9889 Other specified postprocedural states: Secondary | ICD-10-CM | POA: Insufficient documentation

## 2014-01-26 DIAGNOSIS — Z79899 Other long term (current) drug therapy: Secondary | ICD-10-CM | POA: Diagnosis not present

## 2014-01-26 DIAGNOSIS — Z90710 Acquired absence of both cervix and uterus: Secondary | ICD-10-CM | POA: Diagnosis not present

## 2014-01-26 DIAGNOSIS — N760 Acute vaginitis: Secondary | ICD-10-CM | POA: Diagnosis not present

## 2014-01-26 DIAGNOSIS — Z792 Long term (current) use of antibiotics: Secondary | ICD-10-CM | POA: Diagnosis not present

## 2014-01-26 DIAGNOSIS — Z202 Contact with and (suspected) exposure to infections with a predominantly sexual mode of transmission: Secondary | ICD-10-CM | POA: Diagnosis not present

## 2014-01-26 DIAGNOSIS — R1030 Lower abdominal pain, unspecified: Secondary | ICD-10-CM

## 2014-01-26 HISTORY — DX: Acute vaginitis: N76.0

## 2014-01-26 HISTORY — DX: Other specified bacterial agents as the cause of diseases classified elsewhere: B96.89

## 2014-01-26 LAB — I-STAT CHEM 8, ED
BUN: 7 mg/dL (ref 6–23)
CHLORIDE: 104 meq/L (ref 96–112)
Calcium, Ion: 1.14 mmol/L (ref 1.12–1.23)
Creatinine, Ser: 0.7 mg/dL (ref 0.50–1.10)
GLUCOSE: 93 mg/dL (ref 70–99)
HCT: 36 % (ref 36.0–46.0)
HEMOGLOBIN: 12.2 g/dL (ref 12.0–15.0)
POTASSIUM: 3.7 meq/L (ref 3.7–5.3)
Sodium: 141 mEq/L (ref 137–147)
TCO2: 24 mmol/L (ref 0–100)

## 2014-01-26 LAB — URINALYSIS, ROUTINE W REFLEX MICROSCOPIC
BILIRUBIN URINE: NEGATIVE
Glucose, UA: NEGATIVE mg/dL
Hgb urine dipstick: NEGATIVE
Ketones, ur: NEGATIVE mg/dL
Leukocytes, UA: NEGATIVE
Nitrite: NEGATIVE
PH: 6.5 (ref 5.0–8.0)
Protein, ur: NEGATIVE mg/dL
SPECIFIC GRAVITY, URINE: 1.025 (ref 1.005–1.030)
Urobilinogen, UA: 0.2 mg/dL (ref 0.0–1.0)

## 2014-01-26 LAB — WET PREP, GENITAL
Trich, Wet Prep: NONE SEEN
Yeast Wet Prep HPF POC: NONE SEEN

## 2014-01-26 LAB — HIV ANTIBODY (ROUTINE TESTING W REFLEX): HIV: NONREACTIVE

## 2014-01-26 LAB — RPR

## 2014-01-26 MED ORDER — AZITHROMYCIN 250 MG PO TABS
1000.0000 mg | ORAL_TABLET | Freq: Once | ORAL | Status: AC
  Administered 2014-01-26: 1000 mg via ORAL
  Filled 2014-01-26: qty 4

## 2014-01-26 MED ORDER — METRONIDAZOLE 500 MG PO TABS: 500.0000 mg | ORAL_TABLET | Freq: Two times a day (BID) | ORAL | Status: DC

## 2014-01-26 MED ORDER — CEFTRIAXONE SODIUM 250 MG IJ SOLR
250.0000 mg | Freq: Once | INTRAMUSCULAR | Status: AC
  Administered 2014-01-26: 250 mg via INTRAMUSCULAR
  Filled 2014-01-26: qty 250

## 2014-01-26 MED ORDER — STERILE WATER FOR INJECTION IJ SOLN
INTRAMUSCULAR | Status: AC
  Filled 2014-01-26: qty 10

## 2014-01-26 NOTE — ED Notes (Signed)
Med hold, clear at 1240

## 2014-01-26 NOTE — Discharge Instructions (Signed)
Your discharge is related to bacterial vaginosis. Take flagyl as directed and don't drink alcohol. Use tylenol or motrin for pain. Stay well hydrated. Follow up with [facility]D [facility] for future STD concerns or screenings. This is the recommendation by the CDC for people with multiple sexual partners or hx of STDs. You have been treated for gonorrhea and chlamydia in the ER but the hospital will call you if lab is positive. You were tested for HIV and Syphilis, and the hospital will call you if the lab is positive. See your regular doctor in 1wk. Return to the ER for changes or worsening symptoms.   Abdominal Pain Many things can cause belly (abdominal) pain. Most times, the belly pain is not dangerous. Many cases of belly pain can be watched and treated at home. HOME CARE   Do not take medicines that help you go poop (laxatives) unless told to by your doctor.  Only take medicine as told by your doctor.  Eat or drink as told by your doctor. Your doctor will tell you if you should be on a special diet. GET HELP IF:  You do not know what is causing your belly pain.  You have belly pain while you are sick to your stomach (nauseous) or have runny poop (diarrhea).  You have pain while you pee or poop.  Your belly pain wakes you up at night.  You have belly pain that gets worse or better when you eat.  You have belly pain that gets worse when you eat fatty foods.  You have a fever. GET HELP RIGHT AWAY IF:   The pain does not go away within 2 hours.  You keep throwing up (vomiting).  The pain changes and is only in the right or left part of the belly.  You have bloody or tarry looking poop. MAKE SURE YOU:   Understand these instructions.  Will watch your condition.  Will get help right away if you are not doing well or get worse. Document Released: 08/11/2007 Document Revised: 02/27/2013 Document Reviewed: 11/01/2012 [facility] Patient Information 2015  [place], [place]. This information is not intended to replace advice given to you by your health care provider. Make sure you discuss any questions you have with your health care provider.  Bacterial Vaginosis Bacterial vaginosis is a vaginal infection that occurs when the normal balance of bacteria in the vagina is disrupted. It results from an overgrowth of certain bacteria. This is the most common vaginal infection in women of childbearing age. Treatment is important to prevent complications, especially in pregnant women, as it can cause a premature delivery. CAUSES  Bacterial vaginosis is caused by an increase in harmful bacteria that are normally present in smaller amounts in the vagina. Several different kinds of bacteria can cause bacterial vaginosis. However, the reason that the condition develops is not fully understood. RISK FACTORS Certain activities or behaviors can put you at an increased risk of developing bacterial vaginosis, including:  Having a new sex partner or multiple sex partners.  Douching.  Using an intrauterine device (IUD) for contraception. Women do not get bacterial vaginosis from toilet seats, bedding, swimming pools, or contact with objects around them. SIGNS AND SYMPTOMS  Some women with bacterial vaginosis have no signs or symptoms. Common symptoms include:  Grey vaginal discharge.  A fishlike odor with discharge, especially after sexual intercourse.  Itching or burning of the vagina and vulva.  Burning or pain with urination. DIAGNOSIS  Your health care  provider will take a medical history and examine the vagina for signs of bacterial vaginosis. A sample of vaginal fluid may be taken. Your health care provider will look at this sample under a microscope to check for bacteria and abnormal cells. A vaginal pH test may also be done.  TREATMENT  Bacterial vaginosis may be treated with antibiotic medicines. These may be given in the form of a pill or a vaginal  cream. A second round of antibiotics may be prescribed if the condition comes back after treatment.  HOME CARE INSTRUCTIONS   Only take over-the-counter or prescription medicines as directed by your health care provider.  If antibiotic medicine was prescribed, take it as directed. Make sure you finish it even if you start to feel better.  Do not have sex until treatment is completed.  Tell all sexual partners that you have a vaginal infection. They should see their health care provider and be treated if they have problems, such as a mild rash or itching.  Practice safe sex by using condoms and only having one sex partner. SEEK MEDICAL CARE IF:   Your symptoms are not improving after 3 days of treatment.  You have increased discharge or pain.  You have a fever. MAKE SURE YOU:   Understand these instructions.  Will watch your condition.  Will get help right away if you are not doing well or get worse. FOR MORE INFORMATION  Centers for Disease Control and Prevention, Division of STD Prevention: SolutionApps.co.zawww.cdc.gov/std American Sexual Health Association (ASHA): www.ashastd.org  Document Released: 02/22/2005 Document Revised: 12/13/2012 Document Reviewed: 10/04/2012 [facility] Patient Information 2015 [place], [place]. This information is not intended to replace advice given to you by your health care provider. Make sure you discuss any questions you have with your health care provider.  Sexually Transmitted Disease A sexually transmitted disease (STD) is a disease or infection often passed to another person during sex. However, STDs can be passed through nonsexual ways. An STD can be passed through:  Spit (saliva).  Semen.  Blood.  Mucus from the vagina.  Pee (urine). HOW CAN I LESSEN MY CHANCES OF GETTING AN STD?  Use:  Latex condoms.  Water-soluble lubricants with condoms. Do not use petroleum jelly or oils.  Dental dams. These are small pieces of latex that are used as a  barrier during oral sex.  Avoid having more than one sex partner.  Do not have sex with someone who has other sex partners.  Do not have sex with anyone you do not know or who is at high risk for an STD.  Avoid risky sex that can break your skin.  Do not have sex if you have open sores on your mouth or skin.  Avoid drinking too much alcohol or taking illegal drugs. Alcohol and drugs can affect your good judgment.  Avoid oral and anal sex acts.  Get shots (vaccines) for HPV and hepatitis.  If you are at risk of being infected with HIV, it is advised that you take a certain medicine daily to prevent HIV infection. This is called pre-exposure prophylaxis (PrEP). You may be at risk if:  You are a man who has sex with other men (MSM).  You are attracted to the opposite sex (heterosexual) and are having sex with more than one partner.  You take drugs with a needle.  You have sex with someone who has HIV.  Talk with your doctor about if you are at high risk of being infected with  HIV. If you begin to take PrEP, get tested for HIV first. Get tested every 3 months for as long as you are taking PrEP. WHAT SHOULD I DO IF I THINK I HAVE AN STD?  See your doctor.  Tell your sex partner(s) that you have an STD. They should be tested and treated.  Do not have sex until your doctor says it is okay. WHEN SHOULD I GET HELP? Get help right away if:  You have bad belly (abdominal) pain.  You are a man and have puffiness (swelling) or pain in your testicles.  You are a woman and have puffiness in your vagina. Document Released: 04/01/2004 Document Revised: 02/27/2013 Document Reviewed: 08/18/2012 [facility] Patient Information 2015 [place], [place]. This information is not intended to replace advice given to you by your health care provider. Make sure you discuss any questions you have with your health care provider.

## 2014-01-26 NOTE — ED Notes (Signed)
Pt c/o lower abdominal pain and vaginal discharge with odor onset for couple days. Pt reports that the condom broke yesterday and request to be tested for STD's. Pt reports history of bacterial vaginosis.

## 2014-01-26 NOTE — ED Provider Notes (Signed)
CSN: [id number]     Arrival date & time 01/26/14  1046 History   First MD Initiated Contact with Patient 01/26/14 1053     Chief Complaint  Patient presents with  . Abdominal Pain  . Vaginal Discharge     (Consider location/radiation/quality/duration/timing/severity/associated sxs/prior Treatment) HPI Comments: [name] [name] is a 33 y.o. female with a PMHx of BV, and a PSHx of 2 c/s and BTL, who presents to the ED with complaints of vaginal discharge and mild suprapubic discomfort x4 days. Patient reports that this feels very similar to when she had BV in the past. She reports a 2/10 dull suprapubic nonradiating intermittent pain that worsened slightly with sex and with no alleviating factors that she has not tried anything for this pain. She also reports a clear whitish malodorous vaginal discharge that began at the same time. She states this is the exact same way it felt in sept when she had BV. She does state that she douched recently since she came off of her menses approximately 2wks ago (LMP 01/10/14). Denies any fevers, chills, chest pain, shortness of breath, nausea, vomiting, dysuria, hematuria, vaginal irritation, vaginal pain, vaginal bleeding, diarrhea, constipation, hematochezia, melena, obstipation, myalgias, arthralgias. She reports that she is fixated with 2 partners, protected but she had a condom break yesterday and is concerned that she may have been exposed to sexual transmitted diseases, given that this sex was with a new partner.  Patient is a 33 y.o. female presenting with abdominal pain. The history is provided by the patient. No language interpreter was used.  Abdominal Pain Pain location:  Suprapubic Pain quality: dull   Pain radiates to:  Does not radiate Pain severity:  Mild (2/10) Onset quality:  Gradual Duration:  4 days Timing:  Intermittent Progression:  Unchanged Chronicity:  Recurrent (similar to prior episode with BV) Context: recent sexual activity     Relieved by:  None tried Exacerbated by: sexual activity. Ineffective treatments:  None tried Associated symptoms: vaginal discharge   Associated symptoms: no anorexia, no belching, no chest pain, no chills, no constipation, no diarrhea, no dysuria, no fever, no flatus, no hematemesis, no hematochezia, no hematuria, no melena, no nausea, no shortness of breath, no vaginal bleeding and no vomiting   Vaginal discharge:    Quality:  Clear   Severity:  Mild   Onset quality:  Gradual   Duration:  4 days   Timing:  Constant   Progression:  Unchanged   Chronicity:  Recurrent Risk factors: obesity     Past Medical History  Diagnosis Date  . Medical history non-contributory    Past Surgical History  Procedure Laterality Date  . Cesarean section    . Dilation and evacuation N/A 09/12/2012    Procedure: DILATATION AND EVACUATION;  Surgeon: Adam PhenixJames G Arnold, MD;  Location: [facility];  Service: Gynecology;  Laterality: N/A;  . Dilation and curettage of uterus    . Cesarean section N/A 09/04/2013    Procedure: CESAREAN SECTION;  Surgeon: Allie BossierMyra C Dove, MD;  Location: [facility];  Service: Obstetrics;  Laterality: N/A;   Family History  Problem Relation Age of Onset  . Diabetes Mother   . Hypertension Mother   . Hypertension Father   . Stroke Maternal Aunt   . Hearing loss Neg Hx    History  Substance Use Topics  . Smoking status: Former Smoker    Types: Cigarettes  . Smokeless tobacco: Never Used  . Alcohol Use: No   OB History  Gravida Para Term Preterm AB TAB SAB Ectopic Multiple Living   6 3 2 1 3 1 2  0 0 3     Review of Systems  Constitutional: Negative for fever and chills.  Respiratory: Negative for shortness of breath.   Cardiovascular: Negative for chest pain.  Gastrointestinal: Positive for abdominal pain. Negative for nausea, vomiting, diarrhea, constipation, blood in stool, melena, hematochezia, abdominal distention, rectal pain, anorexia, flatus and hematemesis.   Genitourinary: Positive for vaginal discharge. Negative for dysuria, urgency, frequency, hematuria, flank pain, vaginal bleeding, vaginal pain and pelvic pain.  Musculoskeletal: Negative for myalgias and arthralgias.  Skin: Negative for color change.  Neurological: Negative for dizziness, weakness, light-headedness and numbness.   10 Systems reviewed and are negative for acute change except as noted in the HPI.    Allergies  Review of patient's allergies indicates no known allergies.  Home Medications   Prior to Admission medications   Medication Sig Start Date End Date Taking? Authorizing Provider  metroNIDAZOLE (FLAGYL) 500 MG tablet Take 1 tablet (500 mg total) by mouth 2 (two) times daily. 11/07/13   Sena HitchStephen Lozier, MD  Prenatal Vit-Fe Fumarate-FA (PRENATAL MULTIVITAMIN) TABS tablet Take 1 tablet by mouth daily at 12 noon.    Historical Provider, MD   BP 101/57 mmHg  Pulse 101  Temp(Src) 98.2 F (36.8 C) (Oral)  SpO2 100% Physical Exam  Constitutional: She is oriented to person, place, and time. Vital signs are normal. She appears well-developed and well-nourished.  Non-toxic appearance. No distress.  Afebrile, nontoxic, NAD  HENT:  Head: Normocephalic and atraumatic.  Mouth/Throat: Oropharynx is clear and moist and mucous membranes are normal.  Eyes: Conjunctivae and EOM are normal. Right eye exhibits no discharge. Left eye exhibits no discharge.  Neck: Normal range of motion. Neck supple.  Cardiovascular: Normal rate, regular rhythm, normal heart sounds and intact distal pulses.  Exam reveals no gallop and no friction rub.   No murmur heard. Tachycardic per triage exam but this resolved by my exam  Pulmonary/Chest: Effort normal and breath sounds normal. No respiratory distress. She has no decreased breath sounds. She has no wheezes. She has no rhonchi. She has no rales.  Abdominal: Soft. Normal appearance and bowel sounds are normal. She exhibits no distension. There is  tenderness in the suprapubic area. There is no rigidity, no rebound, no guarding, no CVA tenderness, no tenderness at McBurney's point and negative Murphy's sign.    Obese abdomen which limits some aspects of exam. Soft, ND, +BS throughout, with mild suprapubic TTP with no r/g/r, neg murphy's, neg mcburney's, no CVA TTP  Genitourinary: Uterus normal. Pelvic exam was performed with patient supine. There is no rash, tenderness or lesion on the right labia. There is no rash, tenderness or lesion on the left labia. Cervix exhibits discharge. Cervix exhibits no motion tenderness and no friability. Right adnexum displays no mass, no tenderness and no fullness. Left adnexum displays no mass, no tenderness and no fullness. No erythema, tenderness or bleeding in the vagina. No signs of injury around the vagina. Vaginal discharge found.  No rashes, lesions, or tenderness to external genitalia. No erythema, injury, or tenderness to vaginal mucosa. White vaginal discharge in vault, no bleeding within vaginal vault. No adnexal masses, tenderness, or fullness. No CMT or cervical friability, mild mucoid discharge from cervical os. Uterus non-deviated, mobile, nonTTP, and without enlargement.   Musculoskeletal: Normal range of motion.  Neurological: She is alert and oriented to person, place, and time. She has normal  strength. No sensory deficit.  Skin: Skin is warm, dry and intact. No rash noted.  Psychiatric: She has a normal mood and affect.  Nursing note and vitals reviewed.   ED Course  Procedures (including critical care time) Labs Review Labs Reviewed  WET PREP, GENITAL - Abnormal; Notable for the following:    Clue Cells Wet Prep HPF POC MANY (*)    WBC, Wet Prep HPF POC RARE (*)    All other components within normal limits  GC/CHLAMYDIA PROBE AMP  URINALYSIS, ROUTINE W REFLEX MICROSCOPIC  RPR  HIV ANTIBODY (ROUTINE TESTING)  I-STAT CHEM 8, ED   11/06/2013: GC/CT neg, wet prep with few clue  cells  Imaging Review No results found.   EKG Interpretation None      MDM   Final diagnoses:  Bacterial vaginosis  STD exposure  Vaginal discharge  Suprapubic pain, unspecified laterality    32y/o female with suprapubic pain and vaginal discharge. Pt s/p BTL. Pelvic without CMT or concerning findings for PID, doubt need for ultrasound. Will empirically tx for GC/CT. Wet prep revealing BV, will tx for this as well. U/A neg. Afebrile, nonotoxic, benign abd exam. Istat chem 8 WNL. Discussed tylenol/motrin for pain. Will have her f/up with her PCP. I explained the diagnosis and have given explicit precautions to return to the ER including for any other new or worsening symptoms. The patient understands and accepts the medical plan as it's been dictated and I have answered their questions. Discharge instructions concerning home care and prescriptions have been given. The patient is STABLE and is discharged to home in good condition.  BP 101/57 mmHg  Pulse 101  Temp(Src) 98.2 F (36.8 C) (Oral)  Ht 5\' 4"  (1.626 m)  Wt 250 lb (113.399 kg)  BMI 42.89 kg/m2  SpO2 100%  LMP 01/10/2014  Breastfeeding? No  Meds ordered this encounter  Medications  . azithromycin (ZITHROMAX) tablet 1,000 mg    Sig:    And  . cefTRIAXone (ROCEPHIN) injection 250 mg    Sig:     Order Specific Question:  Antibiotic Indication:    Answer:  STD  . metroNIDAZOLE (FLAGYL) 500 MG tablet    Sig: Take 1 tablet (500 mg total) by mouth 2 (two) times daily. One po bid x 7 days    Dispense:  14 tablet    Refill:  0    Order Specific Question:  Supervising Provider    Answer:  Eber Hong D [[id number]]       Donnita [place] Camprubi-Soms, PA-C 01/26/14 1208  Juliet Rude. Rubin Payor, MD 01/27/14 [phone]

## 2014-01-29 LAB — GC/CHLAMYDIA PROBE AMP
CT Probe RNA: NEGATIVE
GC Probe RNA: NEGATIVE

## 2014-04-20 ENCOUNTER — Emergency Department (HOSPITAL_COMMUNITY)
Admission: EM | Admit: 2014-04-20 | Discharge: 2014-04-20 | Disposition: A | Discharge status: Home / Self Care | Payer: Medicaid Other | Attending: Emergency Medicine | Admitting: Emergency Medicine

## 2014-04-20 ENCOUNTER — Encounter (HOSPITAL_COMMUNITY): Payer: Self-pay | Admitting: *Deleted

## 2014-04-20 DIAGNOSIS — Z792 Long term (current) use of antibiotics: Secondary | ICD-10-CM | POA: Diagnosis not present

## 2014-04-20 DIAGNOSIS — Z79899 Other long term (current) drug therapy: Secondary | ICD-10-CM | POA: Diagnosis not present

## 2014-04-20 DIAGNOSIS — N760 Acute vaginitis: Secondary | ICD-10-CM | POA: Insufficient documentation

## 2014-04-20 DIAGNOSIS — Z720 Tobacco use: Secondary | ICD-10-CM | POA: Insufficient documentation

## 2014-04-20 DIAGNOSIS — Z3202 Encounter for pregnancy test, result negative: Secondary | ICD-10-CM | POA: Insufficient documentation

## 2014-04-20 DIAGNOSIS — B9689 Other specified bacterial agents as the cause of diseases classified elsewhere: Secondary | ICD-10-CM

## 2014-04-20 DIAGNOSIS — N898 Other specified noninflammatory disorders of vagina: Secondary | ICD-10-CM | POA: Diagnosis present

## 2014-04-20 LAB — URINALYSIS, ROUTINE W REFLEX MICROSCOPIC
Bilirubin Urine: NEGATIVE
GLUCOSE, UA: NEGATIVE mg/dL
HGB URINE DIPSTICK: NEGATIVE
Ketones, ur: NEGATIVE mg/dL
Leukocytes, UA: NEGATIVE
Nitrite: NEGATIVE
PH: 5.5 (ref 5.0–8.0)
Protein, ur: NEGATIVE mg/dL
Specific Gravity, Urine: 1.024 (ref 1.005–1.030)
UROBILINOGEN UA: 0.2 mg/dL (ref 0.0–1.0)

## 2014-04-20 LAB — WET PREP, GENITAL
TRICH WET PREP: NONE SEEN
Yeast Wet Prep HPF POC: NONE SEEN

## 2014-04-20 LAB — PREGNANCY, URINE: PREG TEST UR: NEGATIVE

## 2014-04-20 MED ORDER — METRONIDAZOLE 500 MG PO TABS: 500.0000 mg | ORAL_TABLET | Freq: Two times a day (BID) | ORAL | Status: DC

## 2014-04-20 NOTE — ED Notes (Addendum)
Vaginal d/c - brown (mild); hx. Of vaginitis. Sexually active.

## 2014-04-20 NOTE — Discharge Instructions (Signed)
Please follow up with your primary care physician in 1-2 days. If you do not have one [facility]. Please take your antibiotic until completion. Please read all discharge instructions and return precautions.    Bacterial Vaginosis Bacterial vaginosis is a vaginal infection that occurs when the normal balance of bacteria in the vagina is disrupted. It results from an overgrowth of certain bacteria. This is the most common vaginal infection in women of childbearing age. Treatment is important to prevent complications, especially in pregnant women, as it can cause a premature delivery. CAUSES  Bacterial vaginosis is caused by an increase in harmful bacteria that are normally present in smaller amounts in the vagina. Several different kinds of bacteria can cause bacterial vaginosis. However, the reason that the condition develops is not fully understood. RISK FACTORS Certain activities or behaviors can put you at an increased risk of developing bacterial vaginosis, including:  Having a new sex partner or multiple sex partners.  Douching.  Using an intrauterine device (IUD) for contraception. Women do not get bacterial vaginosis from toilet seats, bedding, swimming pools, or contact with objects around them. SIGNS AND SYMPTOMS  Some women with bacterial vaginosis have no signs or symptoms. Common symptoms include:  Grey vaginal discharge.  A fishlike odor with discharge, especially after sexual intercourse.  Itching or burning of the vagina and vulva.  Burning or pain with urination. DIAGNOSIS  Your health care provider will take a medical history and examine the vagina for signs of bacterial vaginosis. A sample of vaginal fluid may be taken. Your health care provider will look at this sample under a microscope to check for bacteria and abnormal cells. A vaginal pH test may also be done.  TREATMENT  Bacterial vaginosis may be treated  with antibiotic medicines. These may be given in the form of a pill or a vaginal cream. A second round of antibiotics may be prescribed if the condition comes back after treatment.  HOME CARE INSTRUCTIONS   Only take over-the-counter or prescription medicines as directed by your health care provider.  If antibiotic medicine was prescribed, take it as directed. Make sure you finish it even if you start to feel better.  Do not have sex until treatment is completed.  Tell all sexual partners that you have a vaginal infection. They should see their health care provider and be treated if they have problems, such as a mild rash or itching.  Practice safe sex by using condoms and only having one sex partner. SEEK MEDICAL CARE IF:   Your symptoms are not improving after 3 days of treatment.  You have increased discharge or pain.  You have a fever. MAKE SURE YOU:   Understand these instructions.  Will watch your condition.  Will get help right away if you are not doing well or get worse. FOR MORE INFORMATION  Centers for Disease Control and Prevention, Division of STD Prevention: SolutionApps.co.zawww.cdc.gov/std American Sexual Health Association (ASHA): www.ashastd.org  Document Released: 02/22/2005 Document Revised: 12/13/2012 Document Reviewed: 10/04/2012 [facility] Patient Information 2015 [place], [place]. This information is not intended to replace advice given to you by your health care provider. Make sure you discuss any questions you have with your health care provider.

## 2014-04-20 NOTE — ED Provider Notes (Signed)
CSN: [id number]     Arrival date & time 04/20/14  0901 History   First MD Initiated Contact with Patient 04/20/14 0919     Chief Complaint  Patient presents with  . Vaginal Discharge     (Consider location/radiation/quality/duration/timing/severity/associated sxs/prior Treatment) HPI Comments: Patient is a 34 yo F presenting to the ED for two days of malodorous discharge. 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 She is due to see her Ob/Gyn next week.  Denies any new sexual partners, recent unprotected sexual intercourse , fevers, chills, nausea, vomiting, abdominal pain, diarrhea, constipation , urinary symptoms.   Patient is a 34 y.o. female presenting with vaginal discharge. The history is provided by the patient.  Vaginal Discharge Quality:  Manson PasseyBrown and malodorous Severity:  Mild Onset quality:  Gradual Duration:  2 months (Malodor began 2 days ago) Timing:  Constant Progression:  Worsening Chronicity:  Recurrent Context: spontaneously   Context: not recent antibiotic use   Relieved by:  Nothing Worsened by:  Nothing tried Ineffective treatments:  OTC medications Associated symptoms: no abdominal pain, no dyspareunia, no dysuria, no fever, no genital lesions, no nausea, no rash, no urinary frequency, no urinary hesitancy, no urinary incontinence, no vaginal itching and no vomiting   Risk factors: no endometriosis, no foreign body, no gynecological surgery, no immunosuppression, no new sexual partner, no PID, no STI and no unprotected sex     Past Medical History  Diagnosis Date  . Medical history non-contributory   . BV (bacterial vaginosis)    Past Surgical History  Procedure Laterality Date  . Cesarean section    . Dilation and evacuation N/A 09/12/2012    Procedure: DILATATION AND EVACUATION;  Surgeon: Adam PhenixJames G Arnold, MD;  Location: [facility];  Service: Gynecology;  Laterality: N/A;  . Dilation and curettage of  uterus    . Cesarean section N/A 09/04/2013    Procedure: CESAREAN SECTION;  Surgeon: Allie BossierMyra C Dove, MD;  Location: [facility];  Service: Obstetrics;  Laterality: N/A;   Family History  Problem Relation Age of Onset  . Diabetes Mother   . Hypertension Mother   . Hypertension Father   . Stroke Maternal Aunt   . Hearing loss Neg Hx    History  Substance Use Topics  . Smoking status: Current Every Day Smoker    Types: Cigarettes  . Smokeless tobacco: Never Used  . Alcohol Use: No   OB History    Gravida Para Term Preterm AB TAB SAB Ectopic Multiple Living   6 3 2 1 3 1 2  0 0 3     Review of Systems  Constitutional: Negative for fever.  Gastrointestinal: Negative for nausea, vomiting and abdominal pain.  Genitourinary: Positive for vaginal discharge. Negative for bladder incontinence, dysuria, hesitancy and dyspareunia.  All other systems reviewed and are negative.     Allergies  Review of patient's allergies indicates no known allergies.  Home Medications   Prior to Admission medications   Medication Sig Start Date End Date Taking? Authorizing Provider  metroNIDAZOLE (FLAGYL) 500 MG tablet Take 1 tablet (500 mg total) by mouth 2 (two) times daily. 11/07/13   Sena HitchStephen Lozier, MD  metroNIDAZOLE (FLAGYL) 500 MG tablet Take 1 tablet (500 mg total) by mouth 2 (two) times daily. One po bid x 7 days 01/26/14   Donnita FallsMercedes Strupp Camprubi-Soms, PA-C  metroNIDAZOLE (FLAGYL) 500 MG tablet Take 1 tablet (500 mg total) by mouth  2 (two) times daily. 04/20/14   Lise Auer Zadie Deemer, PA-C  Prenatal Vit-Fe Fumarate-FA (PRENATAL MULTIVITAMIN) TABS tablet Take 1 tablet by mouth daily at 12 noon.    Historical Provider, MD   BP 107/44 mmHg  Pulse 78  Temp(Src) 97.8 F (36.6 C)  Resp 20  SpO2 99%  LMP 03/27/2014 Physical Exam  Constitutional: She is oriented to person, place, and time. She appears well-developed and well-nourished. No distress.  HENT:  Head: Normocephalic and atraumatic.  Right  Ear: External ear normal.  Left Ear: External ear normal.  Nose: Nose normal.  Mouth/Throat: Oropharynx is clear and moist.  Eyes: Conjunctivae are normal.  Neck: Normal range of motion. Neck supple.  Cardiovascular: Normal rate, regular rhythm and normal heart sounds.   Pulmonary/Chest: Effort normal and breath sounds normal. No respiratory distress.  Abdominal: Soft. There is no tenderness.  Musculoskeletal: Normal range of motion.  Neurological: She is alert and oriented to person, place, and time.  Skin: Skin is warm and dry. She is not diaphoretic.  Psychiatric: She has a normal mood and affect.  Nursing note and vitals reviewed.  Exam performed by Francee Piccolo L,  exam chaperoned Date: 04/20/2014 Pelvic exam: normal external genitalia without evidence of trauma. VULVA: normal appearing vulva with no masses, tenderness or lesion. VAGINA: normal appearing vagina with normal color and discharge, no lesions. CERVIX: normal appearing cervix without lesions, cervical motion tenderness absent, cervical os closed with out purulent discharge; vaginal discharge - white and copious, Wet prep and DNA probe for chlamydia and GC obtained.   ADNEXA: normal adnexa in size, nontender and no masses UTERUS: uterus is normal size, shape, consistency and nontender.   ED Course  Procedures (including critical care time) Medications - No data to display  Labs Review Labs Reviewed  WET PREP, GENITAL - Abnormal; Notable for the following:    Clue Cells Wet Prep HPF POC TOO NUMEROUS TO COUNT (*)    WBC, Wet Prep HPF POC RARE (*)    All other components within normal limits  URINALYSIS, ROUTINE W REFLEX MICROSCOPIC  PREGNANCY, URINE  GC/CHLAMYDIA PROBE AMP ([facility])    Imaging Review No results found.   EKG Interpretation None      MDM   Final diagnoses:  Bacterial vaginosis    Filed Vitals:   04/20/14 0930  BP: 107/44  Pulse: 78  Temp:   Resp:      Afebrile,  NAD, non-toxic appearing, AAOx4.  Patient to be discharged with instructions to follow up with OBGYN. Pt understands GC/Chlamydia cultures pending and that they will need to inform all sexual partners within the last 6 months if results return positive. Pt has not been treated prophylacticly with azithromycin and rocephin due to pts history, pelvic exam, and wet prep with only rare WBCs. Pt advised that she will receive a call in 48 hours if the test is positive and to return here or [facility] for treatment. Pt not concerning for PID because hemodynamically stable and no cervical motion tenderness on pelvic exam. Pt has also been treated with flagyl for Bacterial Vaginosis. Pt has been advised to not drink alcohol while on this medication. Patient is stable at time of discharge     Jeannetta Ellis, PA-C 04/20/14 1102  Vanetta Mulders, MD 04/21/14 [phone]

## 2014-04-22 LAB — GC/CHLAMYDIA PROBE AMP (~~LOC~~) NOT AT ARMC
Chlamydia: NEGATIVE
Neisseria Gonorrhea: NEGATIVE

## 2014-09-16 ENCOUNTER — Emergency Department (HOSPITAL_COMMUNITY)
Admission: EM | Admit: 2014-09-16 | Discharge: 2014-09-16 | Disposition: A | Discharge status: Home / Self Care | Payer: Self-pay | Attending: Emergency Medicine | Admitting: Emergency Medicine

## 2014-09-16 ENCOUNTER — Encounter (HOSPITAL_COMMUNITY): Payer: Self-pay

## 2014-09-16 DIAGNOSIS — Z8619 Personal history of other infectious and parasitic diseases: Secondary | ICD-10-CM | POA: Insufficient documentation

## 2014-09-16 DIAGNOSIS — Z8742 Personal history of other diseases of the female genital tract: Secondary | ICD-10-CM | POA: Insufficient documentation

## 2014-09-16 DIAGNOSIS — Z3202 Encounter for pregnancy test, result negative: Secondary | ICD-10-CM | POA: Insufficient documentation

## 2014-09-16 DIAGNOSIS — Z720 Tobacco use: Secondary | ICD-10-CM | POA: Insufficient documentation

## 2014-09-16 DIAGNOSIS — R110 Nausea: Secondary | ICD-10-CM | POA: Insufficient documentation

## 2014-09-16 DIAGNOSIS — R42 Dizziness and giddiness: Secondary | ICD-10-CM | POA: Insufficient documentation

## 2014-09-16 DIAGNOSIS — H538 Other visual disturbances: Secondary | ICD-10-CM | POA: Insufficient documentation

## 2014-09-16 LAB — URINALYSIS, ROUTINE W REFLEX MICROSCOPIC
Bilirubin Urine: NEGATIVE
Glucose, UA: NEGATIVE mg/dL
Hgb urine dipstick: NEGATIVE
Ketones, ur: NEGATIVE mg/dL
Leukocytes, UA: NEGATIVE
Nitrite: NEGATIVE
Protein, ur: NEGATIVE mg/dL
Specific Gravity, Urine: 1.025 (ref 1.005–1.030)
Urobilinogen, UA: 0.2 mg/dL (ref 0.0–1.0)
pH: 6 (ref 5.0–8.0)

## 2014-09-16 LAB — CBC WITH DIFFERENTIAL/PLATELET
Basophils Absolute: 0 10*3/uL (ref 0.0–0.1)
Basophils Relative: 0 % (ref 0–1)
Eosinophils Absolute: 0.1 10*3/uL (ref 0.0–0.7)
Eosinophils Relative: 1 % (ref 0–5)
HCT: 36.3 % (ref 36.0–46.0)
Hemoglobin: 11.6 g/dL — ABNORMAL LOW (ref 12.0–15.0)
Lymphocytes Relative: 55 % — ABNORMAL HIGH (ref 12–46)
Lymphs Abs: 3.5 10*3/uL (ref 0.7–4.0)
MCH: 28.8 pg (ref 26.0–34.0)
MCHC: 32 g/dL (ref 30.0–36.0)
MCV: 90.1 fL (ref 78.0–100.0)
Monocytes Absolute: 0.5 10*3/uL (ref 0.1–1.0)
Monocytes Relative: 8 % (ref 3–12)
Neutro Abs: 2.3 10*3/uL (ref 1.7–7.7)
Neutrophils Relative %: 36 % — ABNORMAL LOW (ref 43–77)
Platelets: 348 10*3/uL (ref 150–400)
RBC: 4.03 MIL/uL (ref 3.87–5.11)
RDW: 12.9 % (ref 11.5–15.5)
WBC: 6.5 10*3/uL (ref 4.0–10.5)

## 2014-09-16 LAB — BASIC METABOLIC PANEL
Anion gap: 8 (ref 5–15)
BUN: 10 mg/dL (ref 6–20)
CO2: 25 mmol/L (ref 22–32)
Calcium: 8.9 mg/dL (ref 8.9–10.3)
Chloride: 104 mmol/L (ref 101–111)
Creatinine, Ser: 0.59 mg/dL (ref 0.44–1.00)
GFR calc Af Amer: >60 mL/min (ref >60)
GFR calc non Af Amer: >60 mL/min (ref >60)
Glucose, Bld: 83 mg/dL (ref 65–99)
Potassium: 3.7 mmol/L (ref 3.5–5.1)
Sodium: 137 mmol/L (ref 135–145)

## 2014-09-16 LAB — TSH: TSH: 1.225 u[IU]/mL (ref 0.350–4.500)

## 2014-09-16 LAB — PREGNANCY, URINE: Preg Test, Ur: NEGATIVE

## 2014-09-16 MED ORDER — MECLIZINE HCL 25 MG PO TABS
25.0000 mg | ORAL_TABLET | Freq: Once | ORAL | Status: AC
  Administered 2014-09-16: 25 mg via ORAL
  Filled 2014-09-16: qty 1

## 2014-09-16 MED ORDER — SODIUM CHLORIDE 0.9 % IV BOLUS (SEPSIS)
1000.0000 mL | Freq: Once | INTRAVENOUS | Status: AC
  Administered 2014-09-16: 1000 mL via INTRAVENOUS

## 2014-09-16 MED ORDER — MECLIZINE HCL 25 MG PO TABS: 25.0000 mg | ORAL_TABLET | Freq: Three times a day (TID) | ORAL | Status: DC | PRN

## 2014-09-16 NOTE — ED Provider Notes (Signed)
CSN: [id number]     Arrival date & time 09/16/14  1112 History   First MD Initiated Contact with Patient 09/16/14 1327     Chief Complaint  Patient presents with  . Headache     (Consider location/radiation/quality/duration/timing/severity/associated sxs/prior Treatment) HPI Patient presents to the emergency department with vertigo with blurry vision and nausea.  The nursing note states that she had a headache but she denies having any headaches.  The patient states that she has had this previously over the last couple of years intermittently.  The patient states that she has not followed up with any body on this issue.  Patient denies weakness, headache, back pain, neck pain, fever, cough, sore throat, chest pain, shortness of breath, diaphoresis, anorexia, weight loss, abdominal pain, vomiting, or syncope Past Medical History  Diagnosis Date  . Medical history non-contributory   . BV (bacterial vaginosis)    Past Surgical History  Procedure Laterality Date  . Cesarean section    . Dilation and evacuation N/A 09/12/2012    Procedure: DILATATION AND EVACUATION;  Surgeon: Adam Phenix, MD;  [facility]: [facility];  Service: Gynecology;  Laterality: N/A;  . Dilation and curettage of uterus    . Cesarean section N/A 09/04/2013    Procedure: CESAREAN SECTION;  Surgeon: Allie Bossier, MD;  Location: [facility];  Service: Obstetrics;  Laterality: N/A;   Family History  Problem Relation Age of Onset  . Diabetes Mother   . Hypertension Mother   . Hypertension Father   . Stroke Maternal Aunt   . Hearing loss Neg Hx    History  Substance Use Topics  . Smoking status: Current Every Day Smoker    Types: Cigarettes  . Smokeless tobacco: Never Used  . Alcohol Use: No   OB History    Gravida Para Term Preterm AB TAB SAB Ectopic Multiple Living   0 0 3     Review of Systems  All other systems negative except as documented in the HPI. All pertinent positives and negatives as reviewed  in the HPI.   Allergies  Review of patient's allergies indicates no known allergies.  Home Medications   Prior to Admission medications   Medication Sig Start Date End Date Taking? Authorizing Provider  metroNIDAZOLE (FLAGYL) 500 MG tablet Take 1 tablet (500 mg total) by mouth 2 (two) times daily. Patient not taking: Reported on 09/16/2014 11/07/13   Sena Hitch, MD  metroNIDAZOLE (FLAGYL) 500 MG tablet Take 1 tablet (500 mg total) by mouth 2 (two) times daily. One po bid x 7 days Patient not taking: Reported on 09/16/2014 01/26/14   Mercedes Camprubi-Soms, PA-C  metroNIDAZOLE (FLAGYL) 500 MG tablet Take 1 tablet (500 mg total) by mouth 2 (two) times daily. Patient not taking: Reported on 09/16/2014 04/20/14   Victorino Dike Piepenbrink, PA-C   BP 122/64 mmHg  Pulse 70  Temp(Src) 98.1 F (36.7 C) (Oral)  Resp 18  SpO2 100%  LMP 09/05/2014 (Approximate) Physical Exam  Constitutional: She is oriented to person, place, and time. She appears well-developed and well-nourished. No distress.  HENT:  Head: Normocephalic and atraumatic.  Mouth/Throat: Oropharynx is clear and moist.  Eyes: Pupils are equal, round, and reactive to light.  Neck: Normal range of motion. Neck supple.  Cardiovascular: Normal rate, regular rhythm and normal heart sounds.  Exam reveals no gallop and no friction rub.   No murmur heard. Pulmonary/Chest: Effort normal and breath sounds normal. No respiratory distress.  She has no wheezes.  Abdominal: Soft. Bowel sounds are normal. She exhibits no distension. There is no tenderness.  Neurological: She is alert and oriented to person, place, and time. She exhibits normal muscle tone. Coordination normal.  Skin: Skin is warm and dry. No rash noted. No erythema.  Psychiatric: She has a normal mood and affect. Her behavior is normal.  Nursing note and vitals reviewed.   ED Course  Procedures (including critical care time) Labs Review Labs Reviewed  URINALYSIS, ROUTINE W  REFLEX MICROSCOPIC (NOT AT [facility]) - Abnormal; Notable for the following:    APPearance CLOUDY (*)    All other components within normal limits  CBC WITH DIFFERENTIAL/PLATELET - Abnormal; Notable for the following:    Hemoglobin 11.6 (*)    Neutrophils Relative % 36 (*)    Lymphocytes Relative 55 (*)    All other components within normal limits  PREGNANCY, URINE  BASIC METABOLIC PANEL  TSH     Patient is advised she will need a primary care doctor.  She could have a condition that is causing her to have the vertigo and hair loss.  She does not have headaches This could represent a thyroid condition, as well  Charlestine NightChristopher Chellie Vanlue, PA-C 09/19/14 0139  Cathren LaineKevin Steinl, MD 09/20/14 [phone]

## 2014-09-16 NOTE — Discharge Instructions (Signed)
Return here as needed. Follow up with the resources provided.  ° ° ° °Emergency Department Resource Guide °1) Find a Doctor and Pay Out of Pocket °Although you won't have to find out who is covered by your insurance plan, it is a good idea to ask around and get recommendations. You will then need to call the office and see if the doctor you have chosen will accept you as a new patient and what types of options they offer for patients who are self-pay. Some doctors offer discounts or will set up payment plans for their patients who do not have insurance, but you will need to ask so you aren't surprised when you get to your appointment. ° °2) Contact Your Local Health Department °Not all health departments have doctors that can see patients for sick visits, but many do, so it is worth a call to see if yours does. If you don't know where your local health department is, you can check in your phone book. The CDC also has a tool to help you locate your state's health department, and many state websites also have listings of all of their local health departments. ° °3) Find a Walk-in Clinic °If your illness is not likely to be very severe or complicated, you may want to try a walk in clinic. These are popping up all over the country in pharmacies, drugstores, and shopping centers. They're usually staffed by nurse practitioners or physician assistants that have been trained to treat common illnesses and complaints. They're usually fairly quick and inexpensive. However, if you have serious medical issues or chronic medical problems, these are probably not your best option. ° °No Primary Care Doctor: °- Call [facility] Connect at  832-8000 - they can help you locate a primary care doctor that  accepts your insurance, provides certain services, etc. °- Physician Referral Service- 1-800-533-3463 ° °Chronic Pain Problems: °Organization         Address  Phone   Notes  °[facility]  (336) 297-2271 Patients need to  be referred by their primary care doctor.  ° °Medication Assistance: °Organization         Address  Phone   Notes  °[place] [facility] [address] °[place], [place] [address] (336) 641-8030 --Must be a resident of [place] °-- Must have NO insurance coverage whatsoever (no Medicaid/ Medicare, etc.) °-- The pt. MUST have a primary care doctor that directs their care regularly and follows them in the community °  °[facility]  (866) 331-1348   °[employer]  (888) 892-1162   ° °Agencies that provide inexpensive medical care: °Organization         Address  Phone   Notes  °[facility]  (336) 832-8035   °[facility]    (336) 832-7272   °[facility] [address] °[place], [place] [address] (336) 832-4777   °[facility] [address], °[place] (336) 271-4999   °[facility]    (336) 373-0678   °[facility]    (336) 272-1050   °[facility] ° [address], [place] Phone:  (336) 832-4444, Fax:  (336) 832-4440 Hours of Operation:  9 am - 6 pm, M-F.  Also accepts Medicaid/Medicare and self-pay.  °[facility] ° [address],  Phone: (336) 832-3150, Fax: (336) 832-3151. Hours of Operation:  8:30 am -   5:30 pm, M-F.  Also accepts Medicaid and self-pay.  °[facility] [place] [address], [place] Phone: (336) 878-6027   °[facility] [address], [place], [place] (336)723-1848, Ext. 123 Mondays & Thursdays: 7-9 AM.  First 15 patients are seen on a first come, first serve basis. °  ° °Medicaid-accepting [place] Providers: ° °Organization         Address  Phone   Notes  °[facility] [address], [place] (336) 641-2100 Also accepts self-pay patients.  °[facility] [address], [place] ° (336) 856-9996   °[facility] [address], [place] (336) 288-8857   °[facility] [address] [place] [address], [place] (336) 299-7000   °Veita Bland [address], [place]  ° (336) 373-1557 Only accepts [place] Access Medicaid patients after they have their name applied to their card.  ° °Self-Pay (no insurance) in [place]: ° °Organization         Address  Phone   Notes  °Sickle Cell Patients, [facility] [address], [place] (336) 832-1970   °[facility] [address], [place] (336) 832-4400   °[facility] [place] ° [address], [place] (336) 992-4800   °[facility]/Dr. Osei-Bonsu ° [address] [place] [address], [place] or [address], [place] (336) 841-8500 Phone number for both [place] and [place] locations is the same.  °[facility] [address], [place] (336) 299-0000   °[facility] [address] [place] [address], [place] or [address] (336) 852-7530 °(336) 878-2260   °[facility] [address], [place] (336) 350-1642, phone; (336) 294-5005, fax Sees patients 1st and 3rd Saturday of every month.  Must not qualify for public or private insurance (i.e. Medicaid, Medicare, [place] Health Choice, Veterans' Benefits) • Household income should be no more than 200% of the poverty level •The clinic cannot treat you if you are pregnant or think you are pregnant • Sexually transmitted diseases are not treated at the clinic.  ° ° °Dental Care: °Organization         Address  Phone  Notes  °[facility] [address], [place] (336) 641-6152 Accepts children up to age 21 who are enrolled in Medicaid or [place] Health Choice; pregnant women with a Medicaid card; and children who have applied for Medicaid or [place] Health Choice, but were declined, whose parents can  pay a reduced fee at time of service.  °[facility] [place]  [address], [place] (336) 641-7733 Accepts children up to age 21 who are enrolled in Medicaid or [place] Health Choice; pregnant women with a Medicaid card; and children who have applied for Medicaid or [place] Health Choice, but were declined, whose parents can pay a reduced fee at time of service.  °[facility] ° [address], [place] (336) 641-4533 Patients are seen by appointment only. Walk-ins are not accepted. [facility] will see patients 18 years of age and older. °Monday - Tuesday (8am-5pm) °Most Wednesdays (8:30-5pm) °$30 per visit, cash only  °[facility] ° [address], [place] (336) 641-4533 Patients are seen by appointment only. Walk-ins are   not accepted. [facility] will see patients 18 years of age and older. °One Wednesday Evening (Monthly: Volunteer Based).  $30 per visit, cash only  °[facility]  (919) 537-3737 for adults; Children under age 4, call Graduate Pediatric Dentistry at (919) [phone]. Children aged 4-14, please call (919) 537-3737 to request a pediatric application. ° Dental services are provided in all areas of dental care including fillings, crowns and bridges, complete and partial dentures, implants, gum treatment, root canals, and extractions. Preventive care is also provided. Treatment is provided to both adults and children. °Patients are selected via a lottery and there is often a waiting list. °  °[facility] [address], °[place] ° (336) 763-8833 [email or web address] °  °[facility] [address], [place], [place] (336)723-1848, Ext. 123 Second and Fourth Thursday of each month, opens at 6:30 AM; Clinic ends at 9 AM.  Patients are seen on a first-come first-served basis, and a limited number are seen during each clinic.  ° °[facility] ° [address], [place], [place] (336) 723-7904   Eligibility Requirements °You must have lived in [place], [place], or [place] counties for at least the last three months. °  You cannot be eligible for state or federal sponsored healthcare insurance, including Veterans Administration, Medicaid, or Medicare. °  You generally cannot be eligible for healthcare insurance through your employer.  °  How to apply: °Eligibility screenings are held every Tuesday and Wednesday afternoon from 1:00 pm until 4:00 pm. You do not need an appointment for the interview!  °[facility] [address], [place], [place] 336-631-2330   °[facility]  336-342-8273   °[facility]  336-703-3100   °[facility]  336-570-6415   ° °Behavioral Health Resources in the Community: °Intensive Outpatient Programs °Organization         Address  Phone  Notes  °[facility] [address], [place], [place] 336-878-6098   °[facility] [address], [place], [place] 336-832-9800   °[facility] [address], [place], [place] ° 336-882-2125   °[facility] [address],  °[place], [place] 1-800-853-5163 or 336-641-4981   °Substance Abuse Resources °Organization         Address  Phone  Notes  °[facility]  336-882-2125   °[facility]  336-784-9470   °[facility]  336-285-9073   °[facility]  336-845-3988   °Residential & Outpatient Substance Abuse Program  1-800-659-3381   °Psychological Services °Organization         Address  Phone  Notes  °[facility]  336- 832-9600   °[facility]  336- 378-7881   °[facility] [address], [place] 1-800-853-5163 or 336-641-4981   ° °Mobile Crisis Teams °Organization         Address  Phone  Notes  °[facility], Mobile Crisis Care Unit  1-877-626-1772    °Assertive °[facility] ° [address] [place], [place] 336-834-9664   °Sharon DeEsch [address] °[place] 336-554-5454   ° °Self-Help/Support Groups °Organization         Address  Phone             Notes  °[facility]  - variety of support groups  336- 373-1402 Call for more information  °Narcotics Anonymous (NA),   [facility] [address], °[place]  2 meetings at this location  ° °Residential Treatment Programs °Organization         Address  Phone  Notes  °[facility] [address],    °[place]  1-866-801-8205   °[facility] ° [address], [place], [place] 704-293-8524   °[facility] [address], [place] 336-845-3988 Admissions: 8am-3pm M-F  °[facility] [address]    °[place], [place] 336-841-1104   °[facility] [address], [place], [place] 336-379-7146   °[facility] [address]  °[place], [place] 336-285-9073   °[facility] Intensive Outpatient [address], [place], [place] 336-852-3033   °[facility] [address]  °[place], [place] 1-877-615-2722 or 336-784-9470   °[facility] [address] [place], [place] 336-227-7417 Accepts Medicaid  °[facility] [address]  °[place] 1-800-659-3381 Substance Abuse/Addiction Treatment  ° °[place] Behavioral Health Resources °Organization         Address  Phone  Notes  °[facility]  (888) 581-9988   °Julie Brannon, PhD [address] [place], [place]   (336) 349-5553 or (336) 951-0000   °[facility]   [address] °[place], [place] (336) 349-4454   °[facility] [address], [place], [place] (336) 342-8316 Insurance/Medicaid/sponsorship through [facility]  °[facility] [address] [place] [address]                                     [place], [place] (336) 342-8316 Therapy/tele-psych/case  °[facility] [address]  ° [place],  (336) 349-2233    °Dr. Arfeen  (336) 349-4544   °[facility] [place]  [employer] [place] [facility] 1) [address], [place] °2) [address], [place] °3)  [address], [place] (336) 349-3220 °(336) 342-7768 ° °(336) 342-8140   °[place] [facility] (336) 342-1394 or (336) 342-3537 (After Hours)    ° ° ° °

## 2014-09-16 NOTE — ED Notes (Addendum)
Pt presents with c/o headache that started this morning. Pt reports dizziness, blurry vision, and nausea with the headache as well. Pt denies any hx of migraines. Pt reports that the symptoms come and go and a co-worker told her it sounded like she had vertigo.

## 2015-04-18 ENCOUNTER — Emergency Department (HOSPITAL_COMMUNITY)
Admission: EM | Admit: 2015-04-18 | Discharge: 2015-04-18 | Disposition: A | Discharge status: Home / Self Care | Payer: Self-pay | Attending: Emergency Medicine | Admitting: Emergency Medicine

## 2015-04-18 ENCOUNTER — Encounter (HOSPITAL_COMMUNITY): Payer: Self-pay | Admitting: Emergency Medicine

## 2015-04-18 DIAGNOSIS — Z202 Contact with and (suspected) exposure to infections with a predominantly sexual mode of transmission: Secondary | ICD-10-CM | POA: Insufficient documentation

## 2015-04-18 DIAGNOSIS — N760 Acute vaginitis: Secondary | ICD-10-CM | POA: Insufficient documentation

## 2015-04-18 DIAGNOSIS — Z711 Person with feared health complaint in whom no diagnosis is made: Secondary | ICD-10-CM

## 2015-04-18 DIAGNOSIS — F17210 Nicotine dependence, cigarettes, uncomplicated: Secondary | ICD-10-CM | POA: Insufficient documentation

## 2015-04-18 DIAGNOSIS — B9689 Other specified bacterial agents as the cause of diseases classified elsewhere: Secondary | ICD-10-CM

## 2015-04-18 LAB — URINALYSIS, ROUTINE W REFLEX MICROSCOPIC
Bilirubin Urine: NEGATIVE
Glucose, UA: NEGATIVE mg/dL
Hgb urine dipstick: NEGATIVE
Ketones, ur: NEGATIVE mg/dL
Leukocytes, UA: NEGATIVE
Nitrite: NEGATIVE
Protein, ur: NEGATIVE mg/dL
Specific Gravity, Urine: 1.033 — ABNORMAL HIGH (ref 1.005–1.030)
pH: 5.5 (ref 5.0–8.0)

## 2015-04-18 LAB — WET PREP, GENITAL
Sperm: NONE SEEN
Trich, Wet Prep: NONE SEEN
Yeast Wet Prep HPF POC: NONE SEEN

## 2015-04-18 MED ORDER — AZITHROMYCIN 250 MG PO TABS
1000.0000 mg | ORAL_TABLET | Freq: Once | ORAL | Status: AC
  Administered 2015-04-18: 1000 mg via ORAL
  Filled 2015-04-18: qty 4

## 2015-04-18 MED ORDER — STERILE WATER FOR INJECTION IJ SOLN
INTRAMUSCULAR | Status: AC
  Administered 2015-04-18: 1.2 mL
  Filled 2015-04-18: qty 10

## 2015-04-18 MED ORDER — CEFTRIAXONE SODIUM 250 MG IJ SOLR
250.0000 mg | Freq: Once | INTRAMUSCULAR | Status: AC
  Administered 2015-04-18: 250 mg via INTRAMUSCULAR
  Filled 2015-04-18: qty 250

## 2015-04-18 MED ORDER — METRONIDAZOLE 500 MG PO TABS: 500.0000 mg | ORAL_TABLET | Freq: Two times a day (BID) | ORAL | Status: DC

## 2015-04-18 NOTE — ED Provider Notes (Signed)
CSN: [id number]     Arrival date & time 04/18/15  1816 History  By signing my name below, I, [name] [name], attest that this documentation has been prepared under the direction and in the presence of Melton Krebs PA-C. Electronically Signed: Placido [name], ED Scribe. 04/18/2015. 9:08 PM.    Chief Complaint  Patient presents with  . Exposure to STD   The history is provided by the patient. No language interpreter was used.    HPI Comments: [name] [name] is a 35 y.o. female with a PMHx of recurrent BV who presents to the Emergency Department requesting an STD screening. She reports having intercourse with a female partner 3x in December with protection who called her today stating he was dx with gonorrhea. She denies any fevers, chills, abd, pain, vaginal discharge or other associated symptoms at this time.   Past Medical History  Diagnosis Date  . Medical history non-contributory   . BV (bacterial vaginosis)    Past Surgical History  Procedure Laterality Date  . Cesarean section    . Dilation and evacuation N/A 09/12/2012    Procedure: DILATATION AND EVACUATION;  Surgeon: Adam Phenix, MD;  Location: [facility];  Service: Gynecology;  Laterality: N/A;  . Dilation and curettage of uterus    . Cesarean section N/A 09/04/2013    Procedure: CESAREAN SECTION;  Surgeon: Allie Bossier, MD;  Location: [facility];  Service: Obstetrics;  Laterality: N/A;   Family History  Problem Relation Age of Onset  . Diabetes Mother   . Hypertension Mother   . Hypertension Father   . Stroke Maternal Aunt   . Hearing loss Neg Hx    Social History  Substance Use Topics  . Smoking status: Current Every Day Smoker    Types: Cigarettes  . Smokeless tobacco: Never Used  . Alcohol Use: No   OB History    Gravida Para Term Preterm AB TAB SAB Ectopic Multiple Living   0 0 3     Review of Systems A complete 10 system review of systems was obtained and all systems are negative except as  noted in the HPI and PMH.   Allergies  Review of patient's allergies indicates no known allergies.  Home Medications   Prior to Admission medications   Medication Sig Start Date End Date Taking? Authorizing Provider  meclizine (ANTIVERT) 25 MG tablet Take 1 tablet (25 mg total) by mouth 3 (three) times daily as needed. 09/16/14   Charlestine Night, PA-C  metroNIDAZOLE (FLAGYL) 500 MG tablet Take 1 tablet (500 mg total) by mouth 2 (two) times daily. Patient not taking: Reported on 09/16/2014 11/07/13   Sena Hitch, MD  metroNIDAZOLE (FLAGYL) 500 MG tablet Take 1 tablet (500 mg total) by mouth 2 (two) times daily. One po bid x 7 days Patient not taking: Reported on 09/16/2014 01/26/14   Mercedes Camprubi-Soms, PA-C  metroNIDAZOLE (FLAGYL) 500 MG tablet Take 1 tablet (500 mg total) by mouth 2 (two) times daily. Patient not taking: Reported on 09/16/2014 04/20/14   Victorino Dike Piepenbrink, PA-C   BP 128/82 mmHg  Pulse 89  Temp(Src) 98.2 F (36.8 C) (Oral)  Resp 18  SpO2 100%  LMP 03/09/2015   Physical Exam  Constitutional: She is oriented to person, place, and time. She appears well-developed and well-nourished.  HENT:  Head: Normocephalic and atraumatic.  Eyes: EOM are normal.  Neck: Normal range of motion.  Cardiovascular: Normal rate.   Pulmonary/Chest:  Effort normal. No respiratory distress.  Abdominal: Soft. She exhibits no distension and no mass. There is no tenderness. There is no rebound and no guarding.  Genitourinary:  Pelvic exam: normal external genitalia, vulva, vagina, cervix, uterus and adnexa.   Musculoskeletal: Normal range of motion.  Neurological: She is alert and oriented to person, place, and time.  Skin: Skin is warm and dry.  Psychiatric: She has a normal mood and affect.  Nursing note and vitals reviewed.   ED Course  Procedures  DIAGNOSTIC STUDIES: Oxygen Saturation is 100% on RA, normal by my interpretation.    COORDINATION OF CARE: 8:44 PM Discussed  next steps with pt including a pelvic exam. Pt verbalized understanding and is agreeable with the plan.   Labs Review Labs Reviewed  WET PREP, GENITAL - Abnormal; Notable for the following:    Clue Cells Wet Prep HPF POC PRESENT (*)    WBC, Wet Prep HPF POC MODERATE (*)    All other components within normal limits  URINALYSIS, ROUTINE W REFLEX MICROSCOPIC (NOT AT [facility]) - Abnormal; Notable for the following:    Specific Gravity, Urine 1.033 (*)    All other components within normal limits  GC/CHLAMYDIA PROBE AMP ([place]) NOT AT [facility]    MDM   Final diagnoses:  Bacterial vaginosis  Concern about STD in female without diagnosis   Patient non-toxic appearing, VSS. Asymptomatic.  Wet prep demonstrates BV.  UA unremarkable.   Medications  cefTRIAXone (ROCEPHIN) injection 250 mg (250 mg Intramuscular Given 04/18/15 2131)  azithromycin (ZITHROMAX) tablet 1,000 mg (1,000 mg Oral Given 04/18/15 2130)  sterile water (preservative free) injection (1.2 mLs  Given 04/18/15 2131)   Patient feels improved after observation and/or treatment in ED.  Patient may be safely discharged home with flagyl.  Discussed reasons for return. Patient to follow-up with gynecology. Patient in understanding and agreement with the plan.   Melton Krebs, PA-C 04/19/15 0125  Benjiman Core, MD 04/19/15 1556

## 2015-04-18 NOTE — ED Notes (Signed)
PT reports that she was called by her sexual partner from December and he told her that he had an STD. Pt alert x4. Pt denies all symptoms.

## 2015-04-18 NOTE — Discharge Instructions (Signed)
[name],  Nice meeting you! Please follow-up with your gynecologist. Return to the emergency department if you develop abdominal pain, excessive abnormal/increased vaginal bleeding. Feel better soon!  S. Lane Hacker, PA-C   Bacterial Vaginosis Bacterial vaginosis is a vaginal infection that occurs when the normal balance of bacteria in the vagina is disrupted. It results from an overgrowth of certain bacteria. This is the most common vaginal infection in women of childbearing age. Treatment is important to prevent complications, especially in pregnant women, as it can cause a premature delivery. CAUSES  Bacterial vaginosis is caused by an increase in harmful bacteria that are normally present in smaller amounts in the vagina. Several different kinds of bacteria can cause bacterial vaginosis. However, the reason that the condition develops is not fully understood. RISK FACTORS Certain activities or behaviors can put you at an increased risk of developing bacterial vaginosis, including:  Having a new sex partner or multiple sex partners.  Douching.  Using an intrauterine device (IUD) for contraception. Women do not get bacterial vaginosis from toilet seats, bedding, swimming pools, or contact with objects around them. SIGNS AND SYMPTOMS  Some women with bacterial vaginosis have no signs or symptoms. Common symptoms include:  Grey vaginal discharge.  A fishlike odor with discharge, especially after sexual intercourse.  Itching or burning of the vagina and vulva.  Burning or pain with urination. DIAGNOSIS  Your health care provider will take a medical history and examine the vagina for signs of bacterial vaginosis. A sample of vaginal fluid may be taken. Your health care provider will look at this sample under a microscope to check for bacteria and abnormal cells. A vaginal pH test may also be done.  TREATMENT  Bacterial vaginosis may be treated with antibiotic medicines.  These may be given in the form of a pill or a vaginal cream. A second round of antibiotics may be prescribed if the condition comes back after treatment. Because bacterial vaginosis increases your risk for sexually transmitted diseases, getting treated can help reduce your risk for chlamydia, gonorrhea, HIV, and herpes. HOME CARE INSTRUCTIONS   Only take over-the-counter or prescription medicines as directed by your health care provider.  If antibiotic medicine was prescribed, take it as directed. Make sure you finish it even if you start to feel better.  Tell all sexual partners that you have a vaginal infection. They should see their health care provider and be treated if they have problems, such as a mild rash or itching.  During treatment, it is important that you follow these instructions:  Avoid sexual activity or use condoms correctly.  Do not douche.  Avoid alcohol as directed by your health care provider.  Avoid breastfeeding as directed by your health care provider. SEEK MEDICAL CARE IF:   Your symptoms are not improving after 3 days of treatment.  You have increased discharge or pain.  You have a fever. MAKE SURE YOU:   Understand these instructions.  Will watch your condition.  Will get help right away if you are not doing well or get worse. FOR MORE INFORMATION  Centers for Disease Control and Prevention, Division of STD Prevention: [employer]s.[employer] American Sexual Health Association (ASHA): www.ashastd.org    This information is not intended to replace advice given to you by your health care provider. Make sure you discuss any questions you have with your health care provider.   Document Released: 02/22/2005 Document Revised: 03/15/2014 Document Reviewed: 10/04/2012 Elsevier Interactive Patient Education [employer]

## 2015-04-21 LAB — GC/CHLAMYDIA PROBE AMP (~~LOC~~) NOT AT ARMC
Chlamydia: NEGATIVE
Neisseria Gonorrhea: NEGATIVE

## 2015-10-07 ENCOUNTER — Encounter (HOSPITAL_COMMUNITY): Payer: Self-pay | Admitting: *Deleted

## 2015-10-07 ENCOUNTER — Emergency Department (HOSPITAL_COMMUNITY)
Admission: EM | Admit: 2015-10-07 | Discharge: 2015-10-07 | Disposition: A | Discharge status: Home / Self Care | Payer: Self-pay | Attending: Emergency Medicine | Admitting: Emergency Medicine

## 2015-10-07 DIAGNOSIS — N760 Acute vaginitis: Secondary | ICD-10-CM | POA: Insufficient documentation

## 2015-10-07 DIAGNOSIS — F17210 Nicotine dependence, cigarettes, uncomplicated: Secondary | ICD-10-CM | POA: Insufficient documentation

## 2015-10-07 DIAGNOSIS — Z79899 Other long term (current) drug therapy: Secondary | ICD-10-CM | POA: Insufficient documentation

## 2015-10-07 DIAGNOSIS — B9689 Other specified bacterial agents as the cause of diseases classified elsewhere: Secondary | ICD-10-CM

## 2015-10-07 LAB — CBC
HEMATOCRIT: 34.7 % — AB (ref 36.0–46.0)
HEMOGLOBIN: 11.6 g/dL — AB (ref 12.0–15.0)
MCH: 30.4 pg (ref 26.0–34.0)
MCHC: 33.4 g/dL (ref 30.0–36.0)
MCV: 91.1 fL (ref 78.0–100.0)
Platelets: 351 10*3/uL (ref 150–400)
RBC: 3.81 MIL/uL — AB (ref 3.87–5.11)
RDW: 13 % (ref 11.5–15.5)
WBC: 7.5 10*3/uL (ref 4.0–10.5)

## 2015-10-07 LAB — COMPREHENSIVE METABOLIC PANEL
ALBUMIN: 4 g/dL (ref 3.5–5.0)
ALT: 17 U/L (ref 14–54)
ANION GAP: 7 (ref 5–15)
AST: 17 U/L (ref 15–41)
Alkaline Phosphatase: 81 U/L (ref 38–126)
BILIRUBIN TOTAL: 0.3 mg/dL (ref 0.3–1.2)
BUN: 12 mg/dL (ref 6–20)
CO2: 25 mmol/L (ref 22–32)
Calcium: 8.6 mg/dL — ABNORMAL LOW (ref 8.9–10.3)
Chloride: 107 mmol/L (ref 101–111)
Creatinine, Ser: 0.72 mg/dL (ref 0.44–1.00)
GFR calc non Af Amer: >60 mL/min (ref >60)
GLUCOSE: 94 mg/dL (ref 65–99)
POTASSIUM: 3.9 mmol/L (ref 3.5–5.1)
SODIUM: 139 mmol/L (ref 135–145)
TOTAL PROTEIN: 7.4 g/dL (ref 6.5–8.1)

## 2015-10-07 LAB — URINALYSIS, ROUTINE W REFLEX MICROSCOPIC
BILIRUBIN URINE: NEGATIVE
Glucose, UA: NEGATIVE mg/dL
Hgb urine dipstick: NEGATIVE
Ketones, ur: NEGATIVE mg/dL
Leukocytes, UA: NEGATIVE
NITRITE: NEGATIVE
PH: 7.5 (ref 5.0–8.0)
Protein, ur: NEGATIVE mg/dL
SPECIFIC GRAVITY, URINE: 1.027 (ref 1.005–1.030)

## 2015-10-07 LAB — LIPASE, BLOOD: Lipase: 19 U/L (ref 11–51)

## 2015-10-07 LAB — WET PREP, GENITAL
SPERM: NONE SEEN
TRICH WET PREP: NONE SEEN
YEAST WET PREP: NONE SEEN

## 2015-10-07 MED ORDER — METRONIDAZOLE 500 MG PO TABS: 500.0000 mg | ORAL_TABLET | Freq: Two times a day (BID) | ORAL | 0 refills | Status: DC

## 2015-10-07 NOTE — Discharge Instructions (Signed)
Please take Flagyl as directed until completion.  Use a condom with every sexual encounter Follow up with your doctor, or OBGYN in regards to today's visit.   Please return to the ER for worsening symptoms, high fevers or persistent vomiting.  You have been tested for HIV, syphilis, chlamydia and gonorrhea. These results will be available in approximately 3 days. You will be notified if they are positive.   SEEK IMMEDIATE MEDICAL CARE IF:  You develop an oral temperature above 102 F (38.9 C), not controlled by medications or lasting more than 2 days.  You develop an increase in pain.  You develop any type of abnormal discharge.  You develop vaginal bleeding and it is not time for your period.  You develop painful intercourse.

## 2015-10-07 NOTE — Progress Notes (Signed)
Patient listed as not having insurance or a pcp. EDCM went to speak to patient at bedside, however, patient's RN at bedside performing procedure.

## 2015-10-07 NOTE — ED Provider Notes (Signed)
[facility]-EMERGENCY DEPT Provider Note   CSN: [id number] Arrival date & time: 10/07/15  1557  First Provider Contact:  First MD Initiated Contact with Patient 10/07/15 1718      History   Chief Complaint Chief Complaint  Patient presents with  . Abdominal Pain  . vaginal odor    HPI [name] SIELOFF is a 35 y.o. female.  [name] [name] is a 35 y.o. female who presents to the Emergency Department for concerns of vaginal infection or STD exposure. Patient states she was placed on antibiotics by her primary care provider for a skin infection a week or two ago. She took antibiotics with resolution of skin infection, however she developed a yeast infection. She tried over-the-counter Monistat cream for three days and states that itching resolved. She is now experiencing a foul-smelling vaginal odor and mild 1/10 left-sided abdominal pain described as a "tingling". She is unsure if she is experiencing vaginal discharge as she is on her menstrual cycle. Denies nausea, vomiting, diarrhea, back pain, dysuria, or any other associated symptoms. Recently had intercourse with her on again off again partner and is also concerned for exposure to STD. Symptoms today feel similar to when she was diagnosed with BV in the past.    The history is provided by the patient and medical records. No language interpreter was used.  Abdominal Pain   Pertinent negatives include fever, diarrhea, nausea, vomiting, constipation, dysuria, frequency and headaches.    Past Medical History:  Diagnosis Date  . BV (bacterial vaginosis)   . Medical history non-contributory     Patient Active Problem List   Diagnosis Date Noted  . Bacterial vaginosis 11/07/2013  . STD exposure 11/07/2013  . Single delivery by C-section 09/04/2013  . Post-operative state 09/04/2013  . Insufficient prenatal care in third trimester 07/20/2013  . 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 Supervision of normal  subsequent pregnancy 04/25/2013  . Incomplete spontaneous abortion without mention of complication 09/13/2012    Past Surgical History:  Procedure Laterality Date  . CESAREAN SECTION    . CESAREAN SECTION N/A 09/04/2013   Procedure: CESAREAN SECTION;  Surgeon: Allie Bossier, MD;  Location: [facility];  Service: Obstetrics;  Laterality: N/A;  . DILATION AND CURETTAGE OF UTERUS    . DILATION AND EVACUATION N/A 09/12/2012   Procedure: DILATATION AND EVACUATION;  Surgeon: Adam Phenix, MD;  Location: [facility];  Service: Gynecology;  Laterality: N/A;    OB History    Gravida Para Term Preterm AB Living   SAB TAB Ectopic Multiple Live Births   2 1 0 0 3       Home Medications    Prior to Admission medications   Medication Sig Start Date End Date Taking? Authorizing Provider  Multiple Vitamin (MULTIVITAMIN WITH MINERALS) TABS tablet Take 1 tablet by mouth daily.   Yes Historical Provider, MD  Multiple Vitamins-Minerals (HAIR SKIN AND NAILS FORMULA PO) Take 2 tablets by mouth daily.   Yes Historical Provider, MD  metroNIDAZOLE (FLAGYL) 500 MG tablet Take 1 tablet (500 mg total) by mouth 2 (two) times daily. 10/07/15   Chase Picket Cashlyn Huguley, PA-C    Family History Family History  Problem Relation Age of Onset  . Diabetes Mother   . Hypertension Mother   . Hypertension Father   . Stroke Maternal Aunt   . Hearing loss Neg Hx     Social History Social History  Substance Use Topics  . Smoking status: Current Every Day Smoker    Types: Cigarettes  . Smokeless tobacco: Never Used  . Alcohol use No     Allergies   Review of patient's allergies indicates no known allergies.   Review of Systems Review of Systems  Constitutional: Negative for chills and fever.  HENT: Negative for congestion.   Eyes: Negative for visual disturbance.  Respiratory: Negative for cough and shortness of breath.   Cardiovascular: Negative.   Gastrointestinal: Positive for abdominal pain. Negative  for constipation, diarrhea, nausea and vomiting.  Genitourinary: Positive for vaginal bleeding (On menstrual cycle). Negative for dysuria, frequency and urgency.       + foul odor.   Musculoskeletal: Negative for back pain and neck pain.  Skin: Negative for rash.  Neurological: Negative for headaches.     Physical Exam Updated Vital Signs BP 124/79   Pulse 67   Temp 98.1 F (36.7 C) (Oral)   Resp 18   LMP 10/03/2015   SpO2 100%   Physical Exam  Constitutional: She is oriented to person, place, and time. She appears well-developed and well-nourished. No distress.  HENT:  Head: Normocephalic and atraumatic.  Cardiovascular: Normal rate, regular rhythm and normal heart sounds.   Pulmonary/Chest: Effort normal and breath sounds normal. No respiratory distress. She has no wheezes. She has no rales.  Abdominal: Soft. Bowel sounds are normal. There is no tenderness.  No tenderness to palpation.   Genitourinary:  Genitourinary Comments: Chaperone present for exam. + white discharge.  No tenderness to external genitalia. No erythema, injury, or tenderness to vaginal mucosa. No adnexal masses, tenderness, or fullness. No CMT.  Musculoskeletal: Normal range of motion.  Neurological: She is alert and oriented to person, place, and time.  Skin: Skin is warm and dry.  Nursing note and vitals reviewed.    ED Treatments / Results  Labs (all labs ordered are listed, but only abnormal results are displayed) Labs Reviewed  WET PREP, GENITAL - Abnormal; Notable for the following:       Result Value   Clue Cells Wet Prep HPF POC PRESENT (*)    WBC, Wet Prep HPF POC MANY (*)    All other components within normal limits  COMPREHENSIVE METABOLIC PANEL - Abnormal; Notable for the following:    Calcium 8.6 (*)    All other components within normal limits  CBC - Abnormal; Notable for the following:    RBC 3.81 (*)    Hemoglobin 11.6 (*)    HCT 34.7 (*)    All other components within normal  limits  URINALYSIS, ROUTINE W REFLEX MICROSCOPIC (NOT AT [facility]) - Abnormal; Notable for the following:    APPearance CLOUDY (*)    All other components within normal limits  LIPASE, BLOOD  RPR  HIV ANTIBODY (ROUTINE TESTING)  GC/CHLAMYDIA PROBE AMP ([place]) NOT AT [facility]    EKG  EKG Interpretation None       Radiology No results found.  Procedures Procedures (including critical care time)  Medications Ordered in ED Medications - No data to display   Initial Impression / Assessment and Plan / ED Course  I have reviewed the triage vital signs and the nursing notes.  Pertinent labs & imaging results that were available during my care of the patient were reviewed by me and considered in my medical decision making (see chart for details).  Clinical Course   REAGEN GOATES presents to ED for foul vaginal odor and  1/10 left sided abdominal pain described as tingling. Denies urinary symptoms. Concerned for STD's. Also states symptoms today feel similar to when she has had BV in the past.   On exam, Patient is afebrile, very well-appearing, and hemodynamically stable. Pelvic exam with discharge, but no cervical motion tenderness. Benign abdominal exam with no tenderness. UA reviewed with no signs of infection. Wet prep with clue cells and many WBCs - will treat with flagyl. Other labs reviewed and reassuring. G & C, RPR, HIV obtained. Patient informed that these results take 3 days and she will be called if they are positive. Patient has follow-up appointment with her OB/GYN on Thursday and was encouraged to keep this scheduled appointment. Return precautions discussed and all questions answered.  Final Clinical Impressions(s) / ED Diagnoses   Final diagnoses:  BV (bacterial vaginosis)    New Prescriptions Discharge Medication List as of 10/07/2015  7:16 PM    START taking these medications   Details  metroNIDAZOLE (FLAGYL) 500 MG tablet Take 1 tablet (500 mg total) by mouth  2 (two) times daily., Starting Tue 10/07/2015, Print         [employer] Jaelan Rasheed, PA-C 10/07/15 1927    Rolan Bucco, MD 10/07/15 [id number]

## 2015-10-07 NOTE — ED Triage Notes (Signed)
Pt complains of left sided abdominal pain and vaginal odor for the past week. Pt states she just finished antibiotics and is concerned she may have yeast infection. Pt states she just finished her menstrual cycle and is unsure if she is having discharge. Pt denies n/v/d.

## 2015-10-08 LAB — GC/CHLAMYDIA PROBE AMP (~~LOC~~) NOT AT ARMC
Chlamydia: NEGATIVE
NEISSERIA GONORRHEA: NEGATIVE

## 2015-10-09 LAB — RPR: RPR: NONREACTIVE

## 2015-10-09 LAB — HIV ANTIBODY (ROUTINE TESTING W REFLEX): HIV Screen 4th Generation wRfx: NONREACTIVE

## 2016-04-02 ENCOUNTER — Encounter (HOSPITAL_COMMUNITY): Payer: Self-pay

## 2016-04-02 ENCOUNTER — Emergency Department (HOSPITAL_COMMUNITY)
Admission: EM | Admit: 2016-04-02 | Discharge: 2016-04-02 | Disposition: A | Discharge status: Home / Self Care | Payer: Self-pay | Attending: Emergency Medicine | Admitting: Emergency Medicine

## 2016-04-02 DIAGNOSIS — Z79899 Other long term (current) drug therapy: Secondary | ICD-10-CM | POA: Insufficient documentation

## 2016-04-02 DIAGNOSIS — N751 Abscess of Bartholin's gland: Secondary | ICD-10-CM | POA: Insufficient documentation

## 2016-04-02 DIAGNOSIS — F17210 Nicotine dependence, cigarettes, uncomplicated: Secondary | ICD-10-CM | POA: Insufficient documentation

## 2016-04-02 MED ORDER — LIDOCAINE HCL 2 % IJ SOLN
20.0000 mL | Freq: Once | INTRAMUSCULAR | Status: AC
  Administered 2016-04-02: 400 mg
  Filled 2016-04-02: qty 20

## 2016-04-02 NOTE — Discharge Instructions (Signed)
Please read attached information. If you experience any new or worsening signs or symptoms please return to the emergency room for evaluation. Please follow-up with your primary care provider or specialist as discussed.  °

## 2016-04-02 NOTE — ED Notes (Signed)
I&D tray and lidocaine at bedside 

## 2016-04-02 NOTE — ED Notes (Signed)
ED Provider at bedside. 

## 2016-04-02 NOTE — ED Triage Notes (Signed)
Pt here with abscess in vaginal area.  Started yesterday. Painful.  No odor or discharge.

## 2016-04-02 NOTE — ED Provider Notes (Signed)
[facility]ER[facility] Provider Note   CSN: [id number] Arrival date & time: 04/02/16  1454     History   Chief Complaint Chief Complaint  Patient presents with  . Abscess    HPI [name] [name] is a 36 y.o. female.  HPI   36 year old female presents today with vaginal abscess. Patient reports she started to notice swelling 2 days ago, pain yesterday to the labia major along the inferior aspect. She denies any discharge, reports minor tenderness, no surrounding redness, no fever chills nausea vomiting. No history of the same.  Past Medical History:  Diagnosis Date  . BV (bacterial vaginosis)   . Medical history non-contributory     Patient Active Problem List   Diagnosis Date Noted  . Bacterial vaginosis 11/07/2013  . STD exposure 11/07/2013  . Single delivery by C-section 09/04/2013  . Post-operative state 09/04/2013  . Insufficient prenatal care in third trimester 07/20/2013  . 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 Supervision of normal subsequent pregnancy 04/25/2013  . Incomplete spontaneous abortion without mention of complication 09/13/2012    Past Surgical History:  Procedure Laterality Date  . CESAREAN SECTION    . CESAREAN SECTION N/A 09/04/2013   Procedure: CESAREAN SECTION;  Surgeon: Allie BossierMyra C Dove, MD;  Location: [facility];  Service: Obstetrics;  Laterality: N/A;  . DILATION AND CURETTAGE OF UTERUS    . DILATION AND EVACUATION N/A 09/12/2012   Procedure: DILATATION AND EVACUATION;  Surgeon: Adam PhenixJames G Arnold, MD;  Location: [facility];  Service: Gynecology;  Laterality: N/A;    OB History    Gravida Para Term Preterm AB Living   6 3 2 1 3 3    SAB TAB Ectopic Multiple Live Births   2 1 0 0 3       Home Medications    Prior to Admission medications   Medication Sig Start Date End Date Taking? Authorizing Provider  metroNIDAZOLE (FLAGYL) 500 MG tablet Take 1 tablet (500 mg total) by mouth 2 (two) times daily. Patient not taking:  Reported on 04/02/2016 10/07/15   Chase PicketJaime Pilcher Ward, PA-C    Family History Family History  Problem Relation Age of Onset  . Diabetes Mother   . Hypertension Mother   . Hypertension Father   . Stroke Maternal Aunt   . Hearing loss Neg Hx     Social History Social History  Substance Use Topics  . Smoking status: Current Every Day Smoker    Types: Cigarettes  . Smokeless tobacco: Never Used  . Alcohol use No     Allergies   Patient has no known allergies.   Review of Systems Review of Systems   Physical Exam Updated Vital Signs BP 106/88 (BP Location: Right Arm)   Pulse 80   Temp 98.3 F (36.8 C) (Oral)   Resp 18   LMP 03/09/2016   SpO2 98%   Physical Exam  Constitutional: She is oriented to person, place, and time. She appears well-developed and well-nourished.  HENT:  Head: Normocephalic and atraumatic.  Eyes: Conjunctivae are normal. Pupils are equal, round, and reactive to light. Right eye exhibits no discharge. Left eye exhibits no discharge. No scleral icterus.  Neck: Normal range of motion. No JVD present. No tracheal deviation present.  Pulmonary/Chest: Effort normal. No stridor.  Genitourinary:  Genitourinary Comments: Significant swelling, fluctuance over the Bartholin's gland right side; no discharge, no surrounding cellulitis  Neurological: She is alert and oriented to person, place, and time. Coordination normal.  Psychiatric: She has a normal mood and affect. Her behavior is normal. Judgment and thought content normal.  Nursing note and vitals reviewed.    ED Treatments / Results  Labs (all labs ordered are listed, but only abnormal results are displayed) Labs Reviewed - No data to display  EKG  EKG Interpretation None       Radiology No results found.  Procedures Procedures (including critical care time)  INCISION AND DRAINAGE Performed by: Thermon Leyland Consent: Verbal consent obtained. Risks and benefits: risks, benefits  and alternatives were discussed Type: abscess  Body area: bartholin gland   Anesthesia: local infiltration  Incision was made with a scalpel.  Local anesthetic: lidocaine 2% without epinephrine  Anesthetic total: 3 ml  Complexity: complex Blunt dissection to break up loculations  Drainage: purulent  Drainage amount: 5cc  Word catheter placed   Patient tolerance: Patient tolerated the procedure well with no immediate complications.    Medications Ordered in ED Medications  lidocaine (XYLOCAINE) 2 % (with pres) injection 400 mg (not administered)     Initial Impression / Assessment and Plan / ED Course  I have reviewed the triage vital signs and the nursing notes.  Pertinent labs & imaging results that were available during my care of the patient were reviewed by me and considered in my medical decision making (see chart for details).     36 year old female presents today with Bartholin's abscess. I&D performed here with success. A Word catheter placed. Patient will follow-up with OB/GYN. No signs of surrounding infection, strict return precautions given symptomatic care instructions given. She verbalized understanding and agreement to today's plan.  Final Clinical Impressions(s) / ED Diagnoses   Final diagnoses:  Bartholin's gland abscess    New Prescriptions New Prescriptions   No medications on file     Eyvonne Mechanic, PA-C 04/02/16 2125    Rolland Porter, MD 04/15/16 1536

## 2016-07-14 ENCOUNTER — Emergency Department (HOSPITAL_COMMUNITY)
Admission: EM | Admit: 2016-07-14 | Discharge: 2016-07-14 | Disposition: A | Discharge status: Home / Self Care | Payer: Self-pay | Attending: Physician Assistant | Admitting: Physician Assistant

## 2016-07-14 ENCOUNTER — Emergency Department (HOSPITAL_COMMUNITY): Payer: Self-pay

## 2016-07-14 DIAGNOSIS — Y9341 Activity, dancing: Secondary | ICD-10-CM | POA: Insufficient documentation

## 2016-07-14 DIAGNOSIS — X501XXA Overexertion from prolonged static or awkward postures, initial encounter: Secondary | ICD-10-CM | POA: Insufficient documentation

## 2016-07-14 DIAGNOSIS — F17210 Nicotine dependence, cigarettes, uncomplicated: Secondary | ICD-10-CM | POA: Insufficient documentation

## 2016-07-14 DIAGNOSIS — Y929 Unspecified place or not applicable: Secondary | ICD-10-CM | POA: Insufficient documentation

## 2016-07-14 DIAGNOSIS — M25571 Pain in right ankle and joints of right foot: Secondary | ICD-10-CM | POA: Insufficient documentation

## 2016-07-14 DIAGNOSIS — Y999 Unspecified external cause status: Secondary | ICD-10-CM | POA: Insufficient documentation

## 2016-07-14 NOTE — Discharge Instructions (Signed)
Read the information below.  You may return to the Emergency Department at any time for worsening condition or any new symptoms that concern you.  If you develop uncontrolled pain, weakness or numbness of the extremity, severe discoloration of the skin, or you are unable to walk or move your foot, return to the ER for a recheck.    °

## 2016-07-14 NOTE — ED Triage Notes (Signed)
Pt c/o ankle pain onset after inversion ankle injury to right ankle, landed on ankle while it twisted. 2+ pedal pulse, sensation and motor function intact.

## 2016-07-14 NOTE — ED Provider Notes (Signed)
[facility]EMERGENCY DEPT Provider Note   CSN: [id number] Arrival date & time: 07/14/16  1142  By signing my name below, I, [name], attest that this documentation has been prepared under the direction and in the presence of [facility] Azucena Dart, PA-C.  Electronically Signed: [name], ED Scribe. 07/14/16. 12:29 PM.  History   Chief Complaint Chief Complaint  Patient presents with  . Ankle Pain   The history is provided by the patient. No language interpreter was used.    HPI Comments: [name] BrighamOriel W [name] is a 36 y.o. female with no pertinent PMHx, who presents to the Emergency Department complaining of sudden onset, persistent right lateral ankle pain beginning yesterday evening. Per pt, she was dancing and while jumping she landed with her right ankle everted. She has been putting ice on the ankle w/o relief of her pain. Her pain is exacerbated with ambulation and alleviated at rest. She denies weakness, numbness, or any other associated symptoms.   Past Medical History:  Diagnosis Date  . BV (bacterial vaginosis)   . Medical history non-contributory    Patient Active Problem List   Diagnosis Date Noted  . Bacterial vaginosis 11/07/2013  . STD exposure 11/07/2013  . Single delivery by C-section 09/04/2013  . Post-operative state 09/04/2013  . Insufficient prenatal care in third trimester 07/20/2013  . 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 Supervision of normal subsequent pregnancy 04/25/2013  . Incomplete spontaneous abortion without mention of complication 09/13/2012   Past Surgical History:  Procedure Laterality Date  . CESAREAN SECTION    . CESAREAN SECTION N/A 09/04/2013   Procedure: CESAREAN SECTION;  Surgeon: Allie BossierMyra C Dove, MD;  Location: [facility];  Service: Obstetrics;  Laterality: N/A;  . DILATION AND CURETTAGE OF UTERUS    . DILATION AND EVACUATION N/A 09/12/2012   Procedure: DILATATION AND EVACUATION;  Surgeon: Adam PhenixJames G Arnold, MD;   Location: [facility];  Service: Gynecology;  Laterality: N/A;   OB History    Gravida Para Term Preterm AB Living   6 3 2 1 3 3    SAB TAB Ectopic Multiple Live Births   2 1 0 0 3     Home Medications    Prior to Admission medications   Medication Sig Start Date End Date Taking? Authorizing Provider  metroNIDAZOLE (FLAGYL) 500 MG tablet Take 1 tablet (500 mg total) by mouth 2 (two) times daily. Patient not taking: Reported on 04/02/2016 10/07/15   Ward, Chase PicketJaime Pilcher, PA-C   Family History Family History  Problem Relation Age of Onset  . Diabetes Mother   . Hypertension Mother   . Hypertension Father   . Stroke Maternal Aunt   . Hearing loss Neg Hx    Social History Social History  Substance Use Topics  . Smoking status: Current Every Day Smoker    Types: Cigarettes  . Smokeless tobacco: Never Used  . Alcohol use No   Allergies   Patient has no known allergies.  Review of Systems Review of Systems  Constitutional: Negative for fever.  Cardiovascular: Negative for leg swelling.  Musculoskeletal: Positive for arthralgias and myalgias.  Skin: Negative for color change, pallor and wound.  Allergic/Immunologic: Negative for immunocompromised state.  Neurological: Negative for weakness and numbness.  Hematological: Does not bruise/bleed easily.  Psychiatric/Behavioral: Negative for self-injury.   Physical Exam Updated Vital Signs BP (!) 110/95   Pulse 79   Temp 98.2 F (36.8 C) (Oral)   Resp 16   LMP 07/14/2016  SpO2 100%   Physical Exam  Constitutional: She appears well-developed and well-nourished. No distress.  HENT:  Head: Normocephalic and atraumatic.  Neck: Neck supple.  Pulmonary/Chest: Effort normal.  Musculoskeletal:  Right lower leg w/o focal tenderness. Distal pulses and sensation intact. No erythema or warmth.   Neurological: She is alert.  Skin: She is not diaphoretic.  Nursing note and vitals reviewed.  ED Treatments / Results  DIAGNOSTIC  STUDIES: Oxygen Saturation is 100% on RA, normal by my interpretation.   COORDINATION OF CARE: 12:28 PM-Discussed next steps with pt. Pt verbalized understanding and is agreeable with the plan.   Labs (all labs ordered are listed, but only abnormal results are displayed) Labs Reviewed - No data to display  EKG  EKG Interpretation None      Radiology Dg Ankle Complete Right  Result Date: 07/14/2016 CLINICAL DATA:  Dancing in yard yesterday with son, slipped, twisted and rolled RIGHT ankle, pain and soft tissue swelling laterally EXAM: RIGHT ANKLE - COMPLETE 3+ VIEW COMPARISON:  None FINDINGS: Soft tissue swelling diffusely RIGHT ankle. Osseous mineralization normal. Ankle joint space preserved. No acute fracture, dislocation, or bone destruction. IMPRESSION: No acute osseous abnormalities. Electronically Signed   By: Ulyses Southward M.D.   On: 07/14/2016 12:12   Procedures Procedures   Medications Ordered in ED Medications - No data to display  Initial Impression / Assessment and Plan / ED Course  I have reviewed the triage vital signs and the nursing notes.  Pertinent labs & imaging results that were available during my care of the patient were reviewed by me and considered in my medical decision making (see chart for details).     This is a 36yo female who presents into the ED following eversion event of the right ankle. Patient XR negative for obvious fracture, dislocation, or other bony abnormalities. Pain managed in ED. Patient given ASO, crutches while in ED, conservative therapy recommended and discussed. Patient will be d/c home. Pt is comfortable with above plan and is stable for discharge at this time. All questions were answered prior to disposition.Discussed result, findings, treatment, and follow up  with patient.  Pt given return precautions.  Pt verbalizes understanding and agrees with plan.      Final Clinical Impressions(s) / ED Diagnoses   Final diagnoses:  Acute  right ankle pain   New Prescriptions New Prescriptions   No medications on file   I personally performed the services described in this documentation, which was scribed in my presence. The recorded information has been reviewed and is accurate.     Trixie Dredge, PA-C 07/14/16 1933    Abelino Derrick, MD 07/15/16 [phone]

## 2016-07-20 ENCOUNTER — Encounter (HOSPITAL_COMMUNITY): Payer: Self-pay

## 2016-07-20 ENCOUNTER — Emergency Department (HOSPITAL_COMMUNITY)
Admission: EM | Admit: 2016-07-20 | Discharge: 2016-07-21 | Disposition: A | Discharge status: Home / Self Care | Payer: Self-pay | Attending: Emergency Medicine | Admitting: Emergency Medicine

## 2016-07-20 DIAGNOSIS — Z79899 Other long term (current) drug therapy: Secondary | ICD-10-CM | POA: Insufficient documentation

## 2016-07-20 DIAGNOSIS — F17210 Nicotine dependence, cigarettes, uncomplicated: Secondary | ICD-10-CM | POA: Insufficient documentation

## 2016-07-20 DIAGNOSIS — N751 Abscess of Bartholin's gland: Secondary | ICD-10-CM | POA: Insufficient documentation

## 2016-07-20 DIAGNOSIS — B9689 Other specified bacterial agents as the cause of diseases classified elsewhere: Secondary | ICD-10-CM | POA: Insufficient documentation

## 2016-07-20 DIAGNOSIS — N760 Acute vaginitis: Secondary | ICD-10-CM | POA: Insufficient documentation

## 2016-07-20 LAB — WET PREP, GENITAL
SPERM: NONE SEEN
Trich, Wet Prep: NONE SEEN
Yeast Wet Prep HPF POC: NONE SEEN

## 2016-07-20 LAB — URINALYSIS, ROUTINE W REFLEX MICROSCOPIC
Bilirubin Urine: NEGATIVE
Glucose, UA: NEGATIVE mg/dL
Hgb urine dipstick: NEGATIVE
KETONES UR: NEGATIVE mg/dL
LEUKOCYTES UA: NEGATIVE
Nitrite: NEGATIVE
PH: 6 (ref 5.0–8.0)
Protein, ur: NEGATIVE mg/dL
Specific Gravity, Urine: 1.023 (ref 1.005–1.030)

## 2016-07-20 LAB — POC URINE PREG, ED: Preg Test, Ur: NEGATIVE

## 2016-07-20 MED ORDER — SODIUM BICARBONATE 4 % IV SOLN
5.0000 mL | Freq: Once | INTRAVENOUS | Status: AC
  Administered 2016-07-20: 5 mL via SUBCUTANEOUS
  Filled 2016-07-20: qty 5

## 2016-07-20 MED ORDER — METRONIDAZOLE 500 MG PO TABS: 500.0000 mg | ORAL_TABLET | Freq: Two times a day (BID) | ORAL | 0 refills | Status: DC

## 2016-07-20 MED ORDER — LIDOCAINE HCL (PF) 1 % IJ SOLN
5.0000 mL | Freq: Once | INTRAMUSCULAR | Status: AC
  Administered 2016-07-20: 5 mL
  Filled 2016-07-20: qty 30

## 2016-07-20 NOTE — ED Notes (Signed)
Pt is alert and oriented x 4 and is verbally responsive/ Pt is c/o of discomfort to right side of labia abscess. Pt reports that area appeared suddenly last night. Pt denies any vaginal discharge.

## 2016-07-20 NOTE — ED Triage Notes (Signed)
PT C/O OF AN ABSCESS TO THE RIGHT LABIA SINCE TODAY. PT DENIES FEVER, CHILLS, OR DRAINAGE. PT WOULD ALSO LIKE TO CHECKED FOR BACTERIAL VAGINOSIS.

## 2016-07-20 NOTE — ED Notes (Signed)
Espina, PA is with patient performing pelvic exam and I and D

## 2016-07-20 NOTE — Discharge Instructions (Signed)
Please take Flagyl twice daily for 7 days. Please not drink any alcohol with this antibiotic. Please keep Word catheter on for 2-4 weeks and follow up with your gynecologist or [facility] [place].  Contact a health care provider if: You have increased pain, swelling, or redness in the area of the cyst. Puslike drainage is coming from the cyst. You have a fever. Contact a health care provider if: Your symptoms do not improve, even after treatment. You have more discharge or pain when urinating. You have a fever. You have pain in your abdomen. You have pain during sex. You have vaginal bleeding between periods.

## 2016-07-20 NOTE — ED Provider Notes (Signed)
[facility] Provider Note   CSN: [id number] Arrival date & time: 07/20/16  1923     History   Chief Complaint Chief Complaint  Patient presents with  . Abscess    HPI [name] [name] is a 36 y.o. female with PMHx of BV, chlamydia, syphillis and recent bartholin's gland abscess in January presenting with complaints of abscess to right labia similar to previous. She states she first noticed it yesterday. She reports worsening pain and discomfort. She reports the pain as pressure, intermittent, 1/10. She reports worsening of pain to palpation. She reports not trying anything for her symptoms. She denies fever, chills, n/v/d. She denies vaginal bleeding, vaginal discharge. She reports recent unprotected sexual intercourse with same partner. LMP 07/14/2016. She reports UTD with tetanus shot as of 3 years ago.   The history is provided by the patient. No language interpreter was used.  Abscess  Associated symptoms: no fever, no nausea and no vomiting     Past Medical History:  Diagnosis Date  . BV (bacterial vaginosis)   . Medical history non-contributory     Patient Active Problem List   Diagnosis Date Noted  . Bacterial vaginosis 11/07/2013  . STD exposure 11/07/2013  . Single delivery by C-section 09/04/2013  . Post-operative state 09/04/2013  . Insufficient prenatal care in third trimester 07/20/2013  . 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 Supervision of normal subsequent pregnancy 04/25/2013  . Incomplete spontaneous abortion without mention of complication 09/13/2012    Past Surgical History:  Procedure Laterality Date  . CESAREAN SECTION    . CESAREAN SECTION N/A 09/04/2013   Procedure: CESAREAN SECTION;  Surgeon: Allie Bossier, MD;  Location: [facility];  Service: Obstetrics;  Laterality: N/A;  . DILATION AND CURETTAGE OF UTERUS    . DILATION AND EVACUATION N/A 09/12/2012   Procedure: DILATATION AND EVACUATION;  Surgeon: Adam Phenix, MD;   Location: [facility];  Service: Gynecology;  Laterality: N/A;    OB History    Gravida Para Term Preterm AB Living   6 3 2 1 3 3    SAB TAB Ectopic Multiple Live Births   2 1 0 0 3       Home Medications    Prior to Admission medications   Medication Sig Start Date End Date Taking? Authorizing Provider  ibuprofen (ADVIL,MOTRIN) 200 MG tablet Take 400 mg by mouth every 6 (six) hours as needed (pain).   Yes [provider]  naproxen sodium (ANAPROX) 220 MG tablet Take 220 mg by mouth 2 (two) times daily with a meal.   Yes [provider]  metroNIDAZOLE (FLAGYL) 500 MG tablet Take 1 tablet (500 mg total) by mouth 2 (two) times daily. 07/20/16   Alvina Chou, PA    Family History Family History  Problem Relation Age of Onset  . Diabetes Mother   . Hypertension Mother   . Hypertension Father   . Stroke Maternal Aunt   . Hearing loss Neg Hx     Social History Social History  Substance Use Topics  . Smoking status: Current Every Day Smoker    Types: Cigarettes  . Smokeless tobacco: Never Used  . Alcohol use No     Allergies   Patient has no known allergies.   Review of Systems Review of Systems  Constitutional: Negative for chills and fever.  Respiratory: Negative for cough and shortness of breath.   Cardiovascular: Negative for chest pain.  Gastrointestinal: Negative for abdominal  pain, diarrhea, nausea and vomiting.  Genitourinary: Positive for vaginal pain.       Genital abscess  All other systems reviewed and are negative.    Physical Exam Updated Vital Signs BP (!) 147/69 (BP Location: Left Arm)   Pulse 65   Temp 97.9 F (36.6 C) (Oral)   Resp 12   Ht 5\' 3"  (1.6 m)   Wt 108.6 kg   LMP 07/14/2016   SpO2 100%   BMI 42.39 kg/m   Physical Exam  Constitutional: She appears well-developed and well-nourished.  Well appearing  HENT:  Head: Normocephalic and atraumatic.  Nose: Nose normal.  Eyes: Conjunctivae and EOM are  normal.  Neck: Normal range of motion.  Cardiovascular: Normal rate.   Pulmonary/Chest: Effort normal. No respiratory distress.  Normal work of breathing. No respiratory distress noted.   Abdominal: Soft. There is no tenderness. There is no rebound and no guarding.  Genitourinary:  Genitourinary Comments: About 3cm abscess noted to lower right of right labia minora. TTP. No discharge noted. No surrounding erythema.   Exam performed by Alvina Chou,  exam chaperoned Date: 07/20/2016 Pelvic exam: normal external genitalia without evidence of trauma. VULVA: normal appearing vulva with no masses, tenderness or lesion. VAGINA: normal appearing vagina with normal color and discharge, no lesions. CERVIX: normal appearing cervix without lesions, cervical motion tenderness absent, cervical os closed with out purulent discharge; vaginal discharge - white, creamy and milky, Wet prep and DNA probe for chlamydia and GC obtained.   ADNEXA: normal adnexa in size, nontender and no masses UTERUS: uterus is normal size, shape, consistency and nontender.   Musculoskeletal: Normal range of motion.  Neurological: She is alert.  Skin: Skin is warm. Capillary refill takes less than 2 seconds.  Psychiatric: She has a normal mood and affect. Her behavior is normal.  Nursing note and vitals reviewed.    ED Treatments / Results  Labs (all labs ordered are listed, but only abnormal results are displayed) Labs Reviewed  WET PREP, GENITAL - Abnormal; Notable for the following:       Result Value   Clue Cells Wet Prep HPF POC PRESENT (*)    WBC, Wet Prep HPF POC FEW (*)    All other components within normal limits  URINALYSIS, ROUTINE W REFLEX MICROSCOPIC - Abnormal; Notable for the following:    APPearance HAZY (*)    All other components within normal limits  RPR  HIV ANTIBODY (ROUTINE TESTING)  POC URINE PREG, ED  GC/CHLAMYDIA PROBE AMP ([place]) NOT AT [facility]    EKG  EKG  Interpretation None       Radiology No results found.  Procedures .Marland KitchenIncision and Drainage Date/Time: 07/20/2016 10:50 PM Performed by: Alvina Chou Authorized by: Alvina Chou   Consent:    Consent obtained:  Verbal   Consent given by:  Patient   Risks discussed:  Bleeding, damage to other organs, infection, incomplete drainage and pain   Alternatives discussed:  No treatment and delayed treatment Location:    Type:  Bartholin cyst   Size:  3 cm   Location:  Anogenital   Anogenital location:  Bartholin's gland Pre-procedure details:    Skin preparation:  Betadine Anesthesia (see MAR for exact dosages):    Anesthesia method:  Local infiltration   Local anesthetic:  Lidocaine 1% w/o epi and sodium bicarbonate Procedure details:    Needle aspiration: no     Incision types:  Stab incision and single straight  Incision depth:  Submucosal   Scalpel blade:  11   Wound management:  Irrigated with saline   Drainage:  Serosanguinous   Drainage amount:  Copious   Wound treatment:  Drain placed   Packing materials:  Word catheter Post-procedure details:    Patient tolerance of procedure:  Tolerated well, no immediate complications Comments:     Procedure chaperoned   (including critical care time)  Medications Ordered in ED Medications  lidocaine (PF) (XYLOCAINE) 1 % injection 5 mL (5 mLs Infiltration Given 07/20/16 2220)  sodium bicarbonate (NEUT) 4 % injection 5 mL (5 mLs Subcutaneous Given 07/20/16 2220)     Initial Impression / Assessment and Plan / ED Course  I have reviewed the triage vital signs and the nursing notes.  Pertinent labs & imaging results that were available during my care of the patient were reviewed by me and considered in my medical decision making (see chart for details).    36 year old female presents today with Bartholin's abscess. I&D performed here with success. A Word catheter placed. Patient will follow-up with  OB/GYN. No signs of surrounding infection, strict return precautions given symptomatic care instructions given. She verbalized understanding and agreement to today's plan. Wet prep shows clue cells. We'll give her prescription of Flagyl. Patient instructed not to take any alcohol with taking this antibiotic. Patient understands instructions and verbally understands. She is in agreement with assessment and plan. Reasons to immediately return to ED discussed. Patient declined empiric antibiotic treatment stating she is not concerned for any STDs. Patient made aware of her pending results.    Final Clinical Impressions(s) / ED Diagnoses   Final diagnoses:  Bartholin's gland abscess  Bacterial vaginosis    New Prescriptions New Prescriptions   METRONIDAZOLE (FLAGYL) 500 MG TABLET    Take 1 tablet (500 mg total) by mouth 2 (two) times daily.     Candie Milespina, Jassiel Flye RodneyManuel, GeorgiaPA 07/20/16 2333    Marily MemosMesner, Jason, MD 07/21/16 Jacinta [id number]

## 2016-07-21 LAB — HIV ANTIBODY (ROUTINE TESTING W REFLEX): HIV Screen 4th Generation wRfx: NONREACTIVE

## 2016-07-22 LAB — GC/CHLAMYDIA PROBE AMP (~~LOC~~) NOT AT ARMC
CHLAMYDIA, DNA PROBE: NEGATIVE
NEISSERIA GONORRHEA: NEGATIVE

## 2016-07-22 LAB — RPR, QUANT+TP ABS (REFLEX): T Pallidum Abs: POSITIVE — AB

## 2016-07-22 LAB — RPR: RPR: REACTIVE — AB

## 2016-07-23 ENCOUNTER — Encounter (HOSPITAL_COMMUNITY): Payer: Self-pay | Admitting: Nurse Practitioner

## 2016-07-23 ENCOUNTER — Emergency Department (HOSPITAL_COMMUNITY)
Admission: EM | Admit: 2016-07-23 | Discharge: 2016-07-23 | Disposition: A | Discharge status: Home / Self Care | Payer: Self-pay | Attending: Emergency Medicine | Admitting: Emergency Medicine

## 2016-07-23 DIAGNOSIS — F17210 Nicotine dependence, cigarettes, uncomplicated: Secondary | ICD-10-CM | POA: Insufficient documentation

## 2016-07-23 DIAGNOSIS — A539 Syphilis, unspecified: Secondary | ICD-10-CM | POA: Insufficient documentation

## 2016-07-23 HISTORY — DX: Syphilis, unspecified: A53.9

## 2016-07-23 MED ORDER — PENICILLIN G BENZATHINE 1200000 UNIT/2ML IM SUSP
2.4000 10*6.[IU] | Freq: Once | INTRAMUSCULAR | Status: AC
  Administered 2016-07-23: 2.4 10*6.[IU] via INTRAMUSCULAR
  Filled 2016-07-23: qty 4

## 2016-07-23 NOTE — Discharge Instructions (Signed)
You tested positive for syphilis and were treated with penicillin. The titer levels are more indicative of latent or inactive level from your previous infection rather than a new infection. Follow-up with OB/GYN for further assistance with your previously diagnosed abscess.

## 2016-07-23 NOTE — ED Triage Notes (Signed)
Pt was here 3 days ago, had an STD work up, today she received a call from lab results advising her to come back to treatment as her test results were Syphilis positive. Pt has no other complaints.

## 2016-07-23 NOTE — ED Notes (Signed)
Went to discharge patient. Patient is not in room nor is belongings. Pt left w/o paperwork or discharge teaching. Unable to obtain discharge vitals or signature. Pt ambulatory at discharge.

## 2016-07-23 NOTE — ED Notes (Signed)
Pt got treated for STD's, and got tested positive for syphilis. Pt here for treatment.

## 2016-07-23 NOTE — ED Provider Notes (Signed)
[facility] Provider Note   CSN: [id number] Arrival date & time: 07/23/16  1943  By signing my name below, I, [name], attest that this documentation has been prepared under the direction and in the presence of non-physician practitioner, Harolyn RutherfordShawn Rosemarie Galvis, PA-C. Electronically Signed: [name], Scribe. 07/23/2016. 9:14 PM.  History   Chief Complaint Chief Complaint  Patient presents with  . Follow-up   The history is provided by the patient and medical records. No language interpreter was used.   HPI Comments: [name] BrighamOriel W [name] is a 36 y.o. female, with a PMHx of BV, chlamydia, and syphilis, who presents to the Emergency Department for follow-up on a positive RPR. Patient had STD testing performed on May 15. She states she did not have a painless chancre at that time. She did, however, have a Bartholin's abscess drained. She presents today because she was told she would need treatment for syphilis. She denies any other complaints. Patient states she is sexually active with 1 female partner.  Past Medical History:  Diagnosis Date  . BV (bacterial vaginosis)   . Medical history non-contributory   . Syphilis     Patient Active Problem List   Diagnosis Date Noted  . Bacterial vaginosis 11/07/2013  . STD exposure 11/07/2013  . Single delivery by C-section 09/04/2013  . Post-operative state 09/04/2013  . Insufficient prenatal care in third trimester 07/20/2013  . 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 Supervision of normal subsequent pregnancy 04/25/2013  . Incomplete spontaneous abortion without mention of complication 09/13/2012    Past Surgical History:  Procedure Laterality Date  . CESAREAN SECTION    . CESAREAN SECTION N/A 09/04/2013   Procedure: CESAREAN SECTION;  Surgeon: Allie BossierMyra C Dove, MD;  Location: [facility];  Service: Obstetrics;  Laterality: N/A;  . DILATION AND CURETTAGE OF UTERUS    . DILATION AND EVACUATION N/A 09/12/2012   Procedure:  DILATATION AND EVACUATION;  Surgeon: Adam PhenixJames G Arnold, MD;  Location: [facility];  Service: Gynecology;  Laterality: N/A;    OB History    Gravida Para Term Preterm AB Living   6 3 2 1 3 3    SAB TAB Ectopic Multiple Live Births   2 1 0 0 3       Home Medications    Prior to Admission medications   Medication Sig Start Date End Date Taking? Authorizing Provider  ibuprofen (ADVIL,MOTRIN) 200 MG tablet Take 400 mg by mouth every 6 (six) hours as needed (pain).    [provider]  metroNIDAZOLE (FLAGYL) 500 MG tablet Take 1 tablet (500 mg total) by mouth 2 (two) times daily. 07/20/16   Espina, Lucita LoraFrancisco Manuel, PA  naproxen sodium (ANAPROX) 220 MG tablet Take 220 mg by mouth 2 (two) times daily with a meal.    [provider]    Family History Family History  Problem Relation Age of Onset  . Diabetes Mother   . Hypertension Mother   . Hypertension Father   . Stroke Maternal Aunt   . Hearing loss Neg Hx     Social History Social History  Substance Use Topics  . Smoking status: Current Every Day Smoker    Types: Cigarettes  . Smokeless tobacco: Never Used  . Alcohol use No     Allergies   Patient has no known allergies.   Review of Systems Review of Systems  Constitutional: Negative for fever.  Gastrointestinal: Negative for abdominal pain.  Genitourinary: Negative for dysuria and vaginal discharge.  Positive RPR     Physical Exam Updated Vital Signs Pulse 80   Temp 98.1 F (36.7 C) (Oral)   Resp 18   Wt 239 lb (108.4 kg)   LMP 07/14/2016   SpO2 100%   BMI 42.34 kg/m   Physical Exam  Constitutional: She appears well-developed and well-nourished. No distress.  HENT:  Head: Normocephalic and atraumatic.  Eyes: Conjunctivae are normal.  Neck: Neck supple.  Cardiovascular: Normal rate and regular rhythm.   Pulmonary/Chest: Effort normal.  Abdominal: Soft.  Musculoskeletal: Normal range of motion.  Neurological: She is alert.  Skin:  Skin is warm and dry. She is not diaphoretic.  Psychiatric: She has a normal mood and affect. Her behavior is normal.  Nursing note and vitals reviewed.    ED Treatments / Results  DIAGNOSTIC STUDIES: Oxygen Saturation is 100% on RA, normal by my interpretation.    COORDINATION OF CARE: 9:18 PM- Pt advised of plan for treatment and pt agrees.  Labs (all labs ordered are listed, but only abnormal results are displayed) Labs Reviewed - No data to display  EKG  EKG Interpretation None       Radiology No results found.  Procedures Procedures (including critical care time)  Medications Ordered in ED Medications  penicillin g benzathine (BICILLIN LA) 1200000 UNIT/2ML injection 2.4 Million Units (2.4 Million Units Intramuscular Given 07/23/16 2140)     Initial Impression / Assessment and Plan / ED Course  I have reviewed the triage vital signs and the nursing notes.  Pertinent labs & imaging results that were available during my care of the patient were reviewed by me and considered in my medical decision making (see chart for details).       Patient presents to follow up on positive RPR.  Positive RPR with a 1:2 quant. Negative gonorrhea, chlamydia, and HIV. Spoke with ID specialist and states that this probably not a new infection, but rather a fluctuation in the titer, which is to be expected with someone who has had a previous active syphilis infection. This information was passed on to the patient.   Patient had a 1:128 titer in 2010, then nonreactive RPRs in 2015 and 2017.  Final Clinical Impressions(s) / ED Diagnoses   Final diagnoses:  Syphilis    New Prescriptions Discharge Medication List as of 07/23/2016  9:55 PM     I personally performed the services described in this documentation, which was scribed in my presence. The recorded information has been reviewed and is accurate.    Anselm Pancoast, PA-C 07/24/16 1610    Tegeler, Canary Brim,  MD 08/02/16 [phone]

## 2017-01-18 ENCOUNTER — Encounter (HOSPITAL_COMMUNITY): Payer: Self-pay | Admitting: *Deleted

## 2017-01-18 DIAGNOSIS — Z5321 Procedure and treatment not carried out due to patient leaving prior to being seen by health care provider: Secondary | ICD-10-CM | POA: Insufficient documentation

## 2017-01-18 DIAGNOSIS — R109 Unspecified abdominal pain: Secondary | ICD-10-CM | POA: Insufficient documentation

## 2017-01-18 LAB — CBC
HCT: 37.3 % (ref 36.0–46.0)
Hemoglobin: 12.5 g/dL (ref 12.0–15.0)
MCH: 30.9 pg (ref 26.0–34.0)
MCHC: 33.5 g/dL (ref 30.0–36.0)
MCV: 92.3 fL (ref 78.0–100.0)
Platelets: 363 10*3/uL (ref 150–400)
RBC: 4.04 MIL/uL (ref 3.87–5.11)
RDW: 13.5 % (ref 11.5–15.5)
WBC: 6.9 10*3/uL (ref 4.0–10.5)

## 2017-01-18 LAB — COMPREHENSIVE METABOLIC PANEL
ALBUMIN: 3.8 g/dL (ref 3.5–5.0)
ALK PHOS: 84 U/L (ref 38–126)
ALT: 15 U/L (ref 14–54)
AST: 16 U/L (ref 15–41)
Anion gap: 6 (ref 5–15)
BILIRUBIN TOTAL: 0.4 mg/dL (ref 0.3–1.2)
BUN: 10 mg/dL (ref 6–20)
CALCIUM: 8.8 mg/dL — AB (ref 8.9–10.3)
CO2: 27 mmol/L (ref 22–32)
Chloride: 105 mmol/L (ref 101–111)
Creatinine, Ser: 0.66 mg/dL (ref 0.44–1.00)
GFR calc Af Amer: >60 mL/min (ref >60)
GFR calc non Af Amer: >60 mL/min (ref >60)
GLUCOSE: 109 mg/dL — AB (ref 65–99)
Potassium: 3.8 mmol/L (ref 3.5–5.1)
SODIUM: 138 mmol/L (ref 135–145)
TOTAL PROTEIN: 7.5 g/dL (ref 6.5–8.1)

## 2017-01-18 LAB — LIPASE, BLOOD: Lipase: 30 U/L (ref 11–51)

## 2017-01-19 ENCOUNTER — Emergency Department (HOSPITAL_COMMUNITY)
Admission: EM | Admit: 2017-01-19 | Discharge: 2017-01-19 | Disposition: A | Discharge status: Home / Self Care | Payer: Self-pay | Attending: Emergency Medicine | Admitting: Emergency Medicine

## 2017-01-19 LAB — POC URINE PREG, ED: PREG TEST UR: NEGATIVE

## 2017-01-19 LAB — URINALYSIS, ROUTINE W REFLEX MICROSCOPIC
BACTERIA UA: NONE SEEN
BILIRUBIN URINE: NEGATIVE
Glucose, UA: NEGATIVE mg/dL
Hgb urine dipstick: NEGATIVE
Ketones, ur: NEGATIVE mg/dL
Leukocytes, UA: NEGATIVE
Nitrite: NEGATIVE
PH: 6 (ref 5.0–8.0)
Protein, ur: NEGATIVE mg/dL
SPECIFIC GRAVITY, URINE: 1.005 (ref 1.005–1.030)

## 2017-01-19 NOTE — ED Notes (Signed)
Pt stated, "I am going home now... I've been here since eight o'clock".

## 2017-02-10 ENCOUNTER — Other Ambulatory Visit: Payer: Self-pay

## 2017-02-10 ENCOUNTER — Encounter (HOSPITAL_COMMUNITY): Payer: Self-pay | Admitting: Emergency Medicine

## 2017-02-10 DIAGNOSIS — R102 Pelvic and perineal pain: Secondary | ICD-10-CM | POA: Insufficient documentation

## 2017-02-10 DIAGNOSIS — N898 Other specified noninflammatory disorders of vagina: Secondary | ICD-10-CM | POA: Insufficient documentation

## 2017-02-10 DIAGNOSIS — F1210 Cannabis abuse, uncomplicated: Secondary | ICD-10-CM | POA: Diagnosis not present

## 2017-02-10 DIAGNOSIS — B9689 Other specified bacterial agents as the cause of diseases classified elsewhere: Secondary | ICD-10-CM | POA: Diagnosis not present

## 2017-02-10 DIAGNOSIS — N760 Acute vaginitis: Secondary | ICD-10-CM | POA: Insufficient documentation

## 2017-02-10 DIAGNOSIS — Z87891 Personal history of nicotine dependence: Secondary | ICD-10-CM | POA: Diagnosis not present

## 2017-02-10 LAB — POC URINE PREG, ED: Preg Test, Ur: NEGATIVE

## 2017-02-10 NOTE — ED Triage Notes (Signed)
Pt to ED c/o intermittent lower L abd pain/pelvic pain x 1 week. Patient states she is prone to BV and took a "detox bath" including tea tree oil which she states was very strong. Pt denies discharge or urinary symptoms. Pt states she can't see her MD until next month. LMP 01/29/17, tubal ligation 3 years ago.

## 2017-02-11 ENCOUNTER — Emergency Department (HOSPITAL_COMMUNITY)
Admission: EM | Admit: 2017-02-11 | Discharge: 2017-02-11 | Disposition: A | Discharge status: Home / Self Care | Payer: Managed Care, Other (non HMO) | Attending: Emergency Medicine | Admitting: Emergency Medicine

## 2017-02-11 DIAGNOSIS — B9689 Other specified bacterial agents as the cause of diseases classified elsewhere: Secondary | ICD-10-CM

## 2017-02-11 DIAGNOSIS — N760 Acute vaginitis: Secondary | ICD-10-CM

## 2017-02-11 DIAGNOSIS — R102 Pelvic and perineal pain: Secondary | ICD-10-CM

## 2017-02-11 DIAGNOSIS — N898 Other specified noninflammatory disorders of vagina: Secondary | ICD-10-CM

## 2017-02-11 LAB — URINALYSIS, ROUTINE W REFLEX MICROSCOPIC
BILIRUBIN URINE: NEGATIVE
Glucose, UA: NEGATIVE mg/dL
HGB URINE DIPSTICK: NEGATIVE
Ketones, ur: NEGATIVE mg/dL
Leukocytes, UA: NEGATIVE
Nitrite: NEGATIVE
Protein, ur: NEGATIVE mg/dL
SPECIFIC GRAVITY, URINE: 1.028 (ref 1.005–1.030)
pH: 6 (ref 5.0–8.0)

## 2017-02-11 LAB — WET PREP, GENITAL
SPERM: NONE SEEN
Trich, Wet Prep: NONE SEEN
YEAST WET PREP: NONE SEEN

## 2017-02-11 LAB — GC/CHLAMYDIA PROBE AMP (~~LOC~~) NOT AT ARMC
CHLAMYDIA, DNA PROBE: NEGATIVE
Neisseria Gonorrhea: NEGATIVE

## 2017-02-11 MED ORDER — METRONIDAZOLE 500 MG PO TABS: 500.0000 mg | ORAL_TABLET | Freq: Two times a day (BID) | ORAL | 0 refills | Status: DC

## 2017-02-11 NOTE — ED Notes (Signed)
Patient updated on wait time and delays 

## 2017-02-11 NOTE — ED Notes (Signed)
EDP at bedside  

## 2017-02-11 NOTE — ED Notes (Signed)
PT states understanding of care given, follow up care, and medication prescribed. PT ambulated from ED to car with a steady gait. 

## 2017-02-11 NOTE — ED Provider Notes (Signed)
[facility] Provider Note   CSN: [id number] Arrival date & time: 02/10/17  2131     History   Chief Complaint Chief Complaint  Patient presents with  . Pelvic Pain    HPI [name] [name] is a 36 y.o. female with a PMHx of recurrent BV and prior syphilis (recently treated on 07/23/16) and PSHx of tubal ligation, who presents to the ED with complaints of intermittent suprapubic abd pain/pelvic pain and clear vaginal discharge x1 week.  Patient states this feels similar to when she has had BV in the past.  She states that 2 weeks ago she took a detox bath and used tea tree oil which she does not usually use, and believes this may have caused her to get BV.  She describes her pain is 1/10 intermittent burning nonradiating suprapubic abdominal pain with no known aggravating or alleviating factors, no treatments tried prior to arrival.  LMP was 01/25/17.  She is sexually active with 5 female partners in the last year, 2 of which have been unprotected, but has not had any new sexual partners since her last visit on 07/20/16.  She denies any changes in her feminine products or condom brands. She denies fevers, chills, CP, SOB, nausea/vomiting, diarrhea/constipation, hematuria, dysuria, vaginal bleeding/itching, genital sores, myalgias, arthralgias, numbness, tingling, focal weakness, or any other complaints at this time. Denies recent travel, sick contacts, suspicious food intake, frequent EtOH use, NSAID use, or recent abx. Of note, chart review reveals her last ED visit for BV was on 07/20/16, at which time she also had a bartholin's gland cyst drained; her wet prep showed +clue cells and few WBCs; her GC/CT tests were neg, HIV was neg, but RPR was reactive with 1:2 titer (later treated on 07/23/16 although this was believed to not be an acute infection, but rather a fluctuation in her titer from prior infection).    The history is provided by the patient and medical  records. No language interpreter was used.  Pelvic Pain  This is a new problem. The current episode started more than 2 days ago. The problem occurs rarely. The problem has not changed since onset.Associated symptoms include abdominal pain. Pertinent negatives include no chest pain and no shortness of breath. Nothing aggravates the symptoms. Nothing relieves the symptoms. She has tried nothing for the symptoms. The treatment provided no relief.    Past Medical History:  Diagnosis Date  . BV (bacterial vaginosis)   . Medical history non-contributory   . Syphilis     Patient Active Problem List   Diagnosis Date Noted  . Bacterial vaginosis 11/07/2013  . STD exposure 11/07/2013  . Single delivery by C-section 09/04/2013  . Post-operative state 09/04/2013  . Insufficient prenatal care in third trimester 07/20/2013  . 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 Supervision of normal subsequent pregnancy 04/25/2013  . Incomplete spontaneous abortion without mention of complication 09/13/2012    Past Surgical History:  Procedure Laterality Date  . CESAREAN SECTION    . CESAREAN SECTION N/A 09/04/2013   Procedure: CESAREAN SECTION;  Surgeon: Allie BossierMyra C Dove, MD;  Location: [facility];  Service: Obstetrics;  Laterality: N/A;  . DILATION AND CURETTAGE OF UTERUS    . DILATION AND EVACUATION N/A 09/12/2012   Procedure: DILATATION AND EVACUATION;  Surgeon: Adam PhenixJames G Arnold, MD;  Location: [facility];  Service: Gynecology;  Laterality: N/A;    OB History    Gravida Para Term Preterm AB Living  6 3 2 1 3 3    SAB TAB Ectopic Multiple Live Births   2 1 0 0 3       Home Medications    Prior to Admission medications   Medication Sig Start Date End Date Taking? Authorizing Provider  ibuprofen (ADVIL,MOTRIN) 200 MG tablet Take 400 mg by mouth every 6 (six) hours as needed (pain).    [provider]  Multiple Vitamin (MULTIVITAMIN WITH MINERALS) TABS tablet Take 1 tablet  daily by mouth.    [provider]    Family History Family History  Problem Relation Age of Onset  . Diabetes Mother   . Hypertension Mother   . Hypertension Father   . Stroke Maternal Aunt   . Hearing loss Neg Hx     Social History Social History   Tobacco Use  . Smoking status: Former Smoker    Types: Cigarettes  . Smokeless tobacco: Never Used  Substance Use Topics  . Alcohol use: Yes  . Drug use: Yes    Types: Marijuana     Allergies   Patient has no known allergies.   Review of Systems Review of Systems  Constitutional: Negative for chills and fever.  Respiratory: Negative for shortness of breath.   Cardiovascular: Negative for chest pain.  Gastrointestinal: Positive for abdominal pain. Negative for constipation, diarrhea, nausea and vomiting.  Genitourinary: Positive for pelvic pain and vaginal discharge. Negative for dysuria, genital sores, hematuria, vaginal bleeding and vaginal pain.  Musculoskeletal: Negative for arthralgias and myalgias.  Skin: Negative for color change.  Allergic/Immunologic: Negative for immunocompromised state.  Neurological: Negative for weakness and numbness.  Psychiatric/Behavioral: Negative for confusion.   All other systems reviewed and are negative for acute change except as noted in the HPI.    Physical Exam Updated Vital Signs BP (!) 114/47   Pulse 76   Temp (!) 97.5 F (36.4 C) (Oral)   Resp 16   LMP 01/29/2017 Comment: had 2 periods in November  SpO2 100%   Physical Exam  Constitutional: She is oriented to person, place, and time. Vital signs are normal. She appears well-developed and well-nourished.  Non-toxic appearance. No distress.  Afebrile, nontoxic, NAD  HENT:  Head: Normocephalic and atraumatic.  Mouth/Throat: Oropharynx is clear and moist and mucous membranes are normal.  Eyes: Conjunctivae and EOM are normal. Right eye exhibits no discharge. Left eye exhibits no discharge.  Neck: Normal range  of motion. Neck supple.  Cardiovascular: Normal rate, regular rhythm, normal heart sounds and intact distal pulses. Exam reveals no gallop and no friction rub.  No murmur heard. Pulmonary/Chest: Effort normal and breath sounds normal. No respiratory distress. She has no decreased breath sounds. She has no wheezes. She has no rhonchi. She has no rales.  Abdominal: Soft. Normal appearance and bowel sounds are normal. She exhibits no distension. There is tenderness in the suprapubic area. There is no rigidity, no rebound, no guarding, no CVA tenderness, no tenderness at McBurney's point and negative Murphy's sign.  Soft, nondistended, +BS throughout, with mild suprapubic TTP, no r/g/r, neg murphy's, neg mcburney's, no CVA TTP   Genitourinary: Uterus normal. Pelvic exam was performed with patient supine. There is no rash, tenderness or lesion on the right labia. There is no rash, tenderness or lesion on the left labia. Cervix exhibits no motion tenderness, no discharge and no friability. Right adnexum displays no mass, no tenderness and no fullness. Left adnexum displays no mass, no tenderness and no fullness. No erythema, tenderness  or bleeding in the vagina. Vaginal discharge found.  Genitourinary Comments: Chaperone present for exam. No rashes, lesions, or tenderness to external genitalia. No erythema, injury, or tenderness to vaginal mucosa. No vaginal bleeding within vaginal vault, scant clear vaginal discharge in vaginal vault. No adnexal masses, tenderness, or fullness. No CMT, cervical friability, or discharge from cervical os. Cervical os is closed. Uterus non-deviated, mobile, nonTTP, and without enlargement.    Musculoskeletal: Normal range of motion.  Neurological: She is alert and oriented to person, place, and time. She has normal strength. No sensory deficit.  Skin: Skin is warm, dry and intact. No rash noted.  Psychiatric: She has a normal mood and affect.  Nursing note and vitals  reviewed.    ED Treatments / Results  Labs (all labs ordered are listed, but only abnormal results are displayed) Labs Reviewed  WET PREP, GENITAL - Abnormal; Notable for the following components:      Result Value   Clue Cells Wet Prep HPF POC PRESENT (*)    WBC, Wet Prep HPF POC FEW (*)    All other components within normal limits  URINALYSIS, ROUTINE W REFLEX MICROSCOPIC - Abnormal; Notable for the following components:   APPearance HAZY (*)    All other components within normal limits  POC URINE PREG, ED  GC/CHLAMYDIA PROBE AMP ([place]) NOT AT [facility]    EKG  EKG Interpretation None       Radiology No results found.  Procedures Procedures (including critical care time)  Medications Ordered in ED Medications - No data to display   Initial Impression / Assessment and Plan / ED Course  I have reviewed the triage vital signs and the nursing notes.  Pertinent labs & imaging results that were available during my care of the patient were reviewed by me and considered in my medical decision making (see chart for details).     36 y.o. female here with suprapubic abd pain and clear vaginal discharge that she states feels like her prior BV. On exam, mild suprapubic TTP, nonperitoneal. Work up thus far reveals: U/A unremarkable, Upreg neg. Will perform pelvic exam and get wet prep and GC/CT testing, but doubt need for repeat RPR/HIV testing since she just had those done in May 2018 and hasn't changed sexual partners. Will reassess after pelvic exam.   5:00 AM Pelvic reveals scant clear discharge in vaginal vault, no CMT or adnexal tenderness. Wet prep with +clue cells and few WBCs but otherwise negative. Doubt need for empiric GC/CT treatment. Will send home with flagyl for BV treatment, advised avoidance of harsh soaps/feminine products, discussed no douching. Discussed abstinence until GC/CT test results. F/up with health dept for future STD concerns. Safe sex encouraged,  and discussed having partners tested and treated before re-engaging in intercourse. F/up with PCP/OBGYN in 1wk for recheck of symptoms. I explained the diagnosis and have given explicit precautions to return to the ER including for any other new or worsening symptoms. The patient understands and accepts the medical plan as it's been dictated and I have answered their questions. Discharge instructions concerning home care and prescriptions have been given. The patient is STABLE and is discharged to home in good condition.    Final Clinical Impressions(s) / ED Diagnoses   Final diagnoses:  Pelvic pain in female  Suprapubic pain  Vaginal discharge  Bacterial vaginosis    ED Discharge Orders        Ordered    metroNIDAZOLE (FLAGYL) 500 MG tablet  2 times daily     02/11/17 [address], [place], [place] 02/11/17 0500    Dione Booze, MD 02/11/17 [phone]

## 2017-02-11 NOTE — Discharge Instructions (Signed)
Your wet prep showed bacterial vaginosis, take flagyl as directed until completed, but don't drink alcohol while taking this medication. Avoid harsh soaps/feminine products, avoid douching.   You have been tested for gonorrhea and chlamydia in the ER but the hospital will call you if lab is positive. DO NOT ENGAGE IN SEXUAL ACTIVITY UNTIL YOU FIND OUT ABOUT YOUR RESULTS AND HAVE PARTNERS TESTED AND TREATED. ALL PARTNERS MUST BE TESTED AND TREATED FOR STD'S. ALWAYS USE CONDOMS WHEN ENGAGING IN INTERCOURSE. [facility] for future STD concerns or screenings. This is the recommendation by the CDC for people with multiple sexual partners or history of STDs.  Follow up with your regular doctor/OBGYN in 1 week for recheck of symptoms. [facility] the [facility] (called the [facility]) for any changes or worsening symptoms.

## 2017-03-19 ENCOUNTER — Emergency Department (HOSPITAL_COMMUNITY)
Admission: EM | Admit: 2017-03-19 | Discharge: 2017-03-19 | Disposition: A | Discharge status: Home / Self Care | Payer: Managed Care, Other (non HMO) | Attending: Emergency Medicine | Admitting: Emergency Medicine

## 2017-03-19 ENCOUNTER — Encounter (HOSPITAL_COMMUNITY): Payer: Self-pay | Admitting: Emergency Medicine

## 2017-03-19 DIAGNOSIS — R600 Localized edema: Secondary | ICD-10-CM | POA: Diagnosis present

## 2017-03-19 DIAGNOSIS — N751 Abscess of Bartholin's gland: Secondary | ICD-10-CM | POA: Insufficient documentation

## 2017-03-19 DIAGNOSIS — Z87891 Personal history of nicotine dependence: Secondary | ICD-10-CM | POA: Diagnosis not present

## 2017-03-19 MED ORDER — LIDOCAINE-EPINEPHRINE (PF) 2 %-1:200000 IJ SOLN
20.0000 mL | Freq: Once | INTRAMUSCULAR | Status: AC
  Administered 2017-03-19: 20 mL
  Filled 2017-03-19: qty 20

## 2017-03-19 MED ORDER — DOXYCYCLINE HYCLATE 100 MG PO CAPS: 100.0000 mg | ORAL_CAPSULE | Freq: Two times a day (BID) | ORAL | 0 refills | Status: DC

## 2017-03-19 NOTE — ED Provider Notes (Signed)
[facility]-EMERG[facility] DEPT Provider Note   CSN: [id number] Arrival date & time: 03/19/17  0816     History   Chief Complaint Chief Complaint  Patient presents with  . Abscess  . Vaginitis    HPI [name] [name] is a 37 y.o. female.  HPI Patient is a 37 year old female presents the emergency department complaints of swelling of her right vulva.  She reports no vaginal discharge.  Denies fevers or chills.  Patient only has one sexual partner.  Sexually active.  Symptoms are moderate in severity.   Past Medical History:  Diagnosis Date  . BV (bacterial vaginosis)   . Medical history non-contributory   . Syphilis     Patient Active Problem List   Diagnosis Date Noted  . Bacterial vaginosis 11/07/2013  . STD exposure 11/07/2013  . Single delivery by C-section 09/04/2013  . Post-operative state 09/04/2013  . Insufficient prenatal care in third trimester 07/20/2013  . 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 Supervision of normal subsequent pregnancy 04/25/2013  . Incomplete spontaneous abortion without mention of complication 09/13/2012    Past Surgical History:  Procedure Laterality Date  . CESAREAN SECTION    . CESAREAN SECTION N/A 09/04/2013   Procedure: CESAREAN SECTION;  Surgeon: Allie Bossier, MD;  Location: [facility];  Service: Obstetrics;  Laterality: N/A;  . DILATION AND CURETTAGE OF UTERUS    . DILATION AND EVACUATION N/A 09/12/2012   Procedure: DILATATION AND EVACUATION;  Surgeon: Adam Phenix, MD;  Location: [facility];  Service: Gynecology;  Laterality: N/A;    OB History    Gravida Para Term Preterm AB Living   6 3 2 1 3 3    SAB TAB Ectopic Multiple Live Births   2 1 0 0 3       Home Medications    Prior to Admission medications   Medication Sig Start Date End Date Taking? Authorizing Provider  acetaminophen (TYLENOL) 500 MG tablet Take 500 mg by mouth every 6 (six) hours as needed for headache.   Yes  [provider]  doxycycline (VIBRAMYCIN) 100 MG capsule Take 1 capsule (100 mg total) by mouth 2 (two) times daily. 03/19/17   Azalia Bilis, MD    Family History Family History  Problem Relation Age of Onset  . Diabetes Mother   . Hypertension Mother   . Hypertension Father   . Stroke Maternal Aunt   . Hearing loss Neg Hx     Social History Social History   Tobacco Use  . Smoking status: Former Smoker    Types: Cigarettes  . Smokeless tobacco: Never Used  Substance Use Topics  . Alcohol use: Yes  . Drug use: Yes    Types: Marijuana     Allergies   Patient has no known allergies.   Review of Systems Review of Systems  All other systems reviewed and are negative.    Physical Exam Updated Vital Signs BP 130/84 (BP Location: Right Arm)   Pulse 76   Temp 99.3 F (37.4 C) (Oral)   Resp 18   SpO2 100%   Physical Exam  Constitutional: She is oriented to person, place, and time. She appears well-developed and well-nourished.  HENT:  Head: Normocephalic.  Eyes: EOM are normal.  Neck: Normal range of motion.  Pulmonary/Chest: Effort normal.  Abdominal: She exhibits no distension.  Genitourinary:  Genitourinary Comments: Swelling of the right by phone's gland without drainage.  External genitalia are otherwise normal.  Chaperone present.  Musculoskeletal: Normal range of motion.  Neurological: She is alert and oriented to person, place, and time.  Psychiatric: She has a normal mood and affect.  Nursing note and vitals reviewed.    ED Treatments / Results  Labs (all labs ordered are listed, but only abnormal results are displayed) Labs Reviewed - No data to display  EKG  EKG Interpretation None       Radiology No results found.  Procedures .Marland Kitchen.Incision and Drainage Performed by: Azalia Bilisampos, Kelina Beauchamp, MD Authorized by: Azalia Bilisampos, Deniah Saia, MD     INCISION AND DRAINAGE Performed by: Azalia BilisKevin Savannah Erbe Consent: Verbal consent obtained. Risks and benefits:  risks, benefits and alternatives were discussed Time out performed prior to procedure Type: abscess Body area: right vulva Anesthesia: local infiltration Incision was made with a scalpel. Local anesthetic: lidocaine 2% with epinephrine Anesthetic total: 5 ml Complexity: complex Blunt dissection to break up loculations Drainage: purulent Drainage amount: large Packing material: none Patient tolerance: Patient tolerated the procedure well with no immediate complications.       Medications Ordered in ED Medications  lidocaine-EPINEPHrine (XYLOCAINE W/EPI) 2 %-1:200000 (PF) injection 20 mL (20 mLs Infiltration Given 03/19/17 1333)     Initial Impression / Assessment and Plan / ED Course  I have reviewed the triage vital signs and the nursing notes.  Pertinent labs & imaging results that were available during my care of the patient were reviewed by me and considered in my medical decision making (see chart for details).    Patient tolerated incision and drainage of the right Barthel in the gland.  Home with doxycycline for a week.  Gynecologic follow-up.  Pelvic rest.  Patient understands to return to the ER for new or worsening symptoms   Final Clinical Impressions(s) / ED Diagnoses   Final diagnoses:  Abscess of Bartholin's gland    ED Discharge Orders        Ordered    doxycycline (VIBRAMYCIN) 100 MG capsule  2 times daily     03/19/17 1412       Azalia Bilisampos, Delno Blaisdell, MD 03/19/17 1420

## 2017-03-19 NOTE — ED Triage Notes (Signed)
Patient here from home with complaints of right sided vaginal abscess. States that she shaved on Tuesday and ingrown hair has gotten larger. Also would like to be checked for BV.

## 2017-05-20 ENCOUNTER — Emergency Department (HOSPITAL_COMMUNITY)
Admission: EM | Admit: 2017-05-20 | Discharge: 2017-05-21 | Disposition: A | Discharge status: Home / Self Care | Payer: Managed Care, Other (non HMO) | Attending: Emergency Medicine | Admitting: Emergency Medicine

## 2017-05-20 DIAGNOSIS — Z79899 Other long term (current) drug therapy: Secondary | ICD-10-CM | POA: Diagnosis not present

## 2017-05-20 DIAGNOSIS — Z87891 Personal history of nicotine dependence: Secondary | ICD-10-CM | POA: Diagnosis not present

## 2017-05-20 DIAGNOSIS — K0889 Other specified disorders of teeth and supporting structures: Secondary | ICD-10-CM | POA: Insufficient documentation

## 2017-05-20 DIAGNOSIS — K029 Dental caries, unspecified: Secondary | ICD-10-CM

## 2017-05-21 ENCOUNTER — Other Ambulatory Visit: Payer: Self-pay

## 2017-05-21 ENCOUNTER — Encounter (HOSPITAL_COMMUNITY): Payer: Self-pay | Admitting: Emergency Medicine

## 2017-05-21 MED ORDER — NAPROXEN 500 MG PO TABS
500.0000 mg | ORAL_TABLET | Freq: Once | ORAL | Status: AC
  Administered 2017-05-21: 500 mg via ORAL
  Filled 2017-05-21: qty 1

## 2017-05-21 MED ORDER — PENICILLIN V POTASSIUM 500 MG PO TABS
500.0000 mg | ORAL_TABLET | Freq: Once | ORAL | Status: AC
Start: 2017-05-21 — End: 2017-05-21
  Administered 2017-05-21: 500 mg via ORAL
  Filled 2017-05-21: qty 1

## 2017-05-21 MED ORDER — NAPROXEN 500 MG PO TABS: 500.0000 mg | ORAL_TABLET | Freq: Two times a day (BID) | ORAL | 0 refills | Status: DC

## 2017-05-21 MED ORDER — PENICILLIN V POTASSIUM 500 MG PO TABS: 500.0000 mg | ORAL_TABLET | Freq: Three times a day (TID) | ORAL | 0 refills | Status: DC

## 2017-05-21 NOTE — ED Provider Notes (Signed)
[facility]-EMERG[facility] DEPT Provider Note   CSN: [id number] Arrival date & time: 05/20/17  2254     History   Chief Complaint Chief Complaint  Patient presents with  . Dental Pain    HPI [name] [name] is a 37 y.o. female.  The history is provided by the patient.  Dental Pain   This is a recurrent problem. The current episode started more than 2 days ago. The problem occurs constantly. The problem has not changed since onset.The pain is at a severity of 10/10. The pain is severe. She has tried acetaminophen for the symptoms. The treatment provided no relief.  Has been seen by dentistry who referred the patient to an oral surgeon for multiple extractions.  Has an appointment Wednesday but pain is worse.    Past Medical History:  Diagnosis Date  . BV (bacterial vaginosis)   . Medical history non-contributory   . Syphilis     Patient Active Problem List   Diagnosis Date Noted  . Bacterial vaginosis 11/07/2013  . STD exposure 11/07/2013  . Single delivery by C-section 09/04/2013  . Post-operative state 09/04/2013  . Insufficient prenatal care in third trimester 07/20/2013  . 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 Supervision of normal subsequent pregnancy 04/25/2013  . Incomplete spontaneous abortion without mention of complication 09/13/2012    Past Surgical History:  Procedure Laterality Date  . CESAREAN SECTION    . CESAREAN SECTION N/A 09/04/2013   Procedure: CESAREAN SECTION;  Surgeon: Allie Bossier, MD;  Location: [facility];  Service: Obstetrics;  Laterality: N/A;  . DILATION AND CURETTAGE OF UTERUS    . DILATION AND EVACUATION N/A 09/12/2012   Procedure: DILATATION AND EVACUATION;  Surgeon: Adam Phenix, MD;  Location: [facility];  Service: Gynecology;  Laterality: N/A;    OB History    Gravida Para Term Preterm AB Living   6 3 2 1 3 3    SAB TAB Ectopic Multiple Live Births   2 1 0 0 3       Home Medications     Prior to Admission medications   Medication Sig Start Date End Date Taking? Authorizing Provider  acetaminophen (TYLENOL) 500 MG tablet Take 500 mg by mouth every 6 (six) hours as needed for headache.    [provider]  doxycycline (VIBRAMYCIN) 100 MG capsule Take 1 capsule (100 mg total) by mouth 2 (two) times daily. 03/19/17   Azalia Bilis, MD    Family History Family History  Problem Relation Age of Onset  . Diabetes Mother   . Hypertension Mother   . Hypertension Father   . Stroke Maternal Aunt   . Hearing loss Neg Hx     Social History Social History   Tobacco Use  . Smoking status: Former Smoker    Types: Cigarettes  . Smokeless tobacco: Never Used  Substance Use Topics  . Alcohol use: Yes  . Drug use: Yes    Types: Marijuana     Allergies   Patient has no known allergies.   Review of Systems Review of Systems  Constitutional: Negative for fever.  HENT: Positive for dental problem. Negative for drooling and facial swelling.   Respiratory: Negative for shortness of breath.   Cardiovascular: Negative for chest pain.  All other systems reviewed and are negative.    Physical Exam Updated Vital Signs BP 132/85 (BP Location: Left Arm)   Pulse 80   Temp 98.2 F (36.8 C) (Oral)  Resp 20   Ht 5\' 3"  (1.6 m)   Wt 89.4 kg (197 lb)   LMP 05/15/2017   SpO2 100%   BMI 34.90 kg/m   Physical Exam  Constitutional: She is oriented to person, place, and time. She appears well-developed and well-nourished. No distress.  HENT:  Head: Normocephalic and atraumatic.  Nose: Nose normal.  Mouth/Throat: No oropharyngeal exudate.  No trismus widespread dental decay  Eyes: Conjunctivae are normal. Pupils are equal, round, and reactive to light.  Neck: Normal range of motion. Neck supple.  Cardiovascular: Normal rate, regular rhythm, normal heart sounds and intact distal pulses.  Pulmonary/Chest: Effort normal and breath sounds normal.  Abdominal: Soft.  Bowel sounds are normal.  Musculoskeletal: Normal range of motion.  Neurological: She is alert and oriented to person, place, and time.  Skin: Skin is warm and dry. Capillary refill takes less than 2 seconds.  Psychiatric: She has a normal mood and affect.     ED Treatments / Results  Labs  Procedures Procedures (including critical care time)  Medications Ordered in ED Medications  penicillin v potassium (VEETID) tablet 500 mg (not administered)  naproxen (NAPROSYN) tablet 500 mg (not administered)       Final Clinical Impressions(s) / ED Diagnoses   Return for weakness, numbness, changes in vision or speech, fevers >100.4 unrelieved by medication, shortness of breath, intractable vomiting, or diarrhea, abdominal pain, Inability to tolerate liquids or food, cough, altered mental status or any concerns. No signs of systemic illness or infection. The patient is nontoxic-appearing on exam and vital signs are within normal limits.   I have reviewed the triage vital signs and the nursing notes. Pertinent labs &imaging results that were available during my care of the patient were reviewed by me and considered in my medical decision making (see chart for details).  After history, exam, and medical workup I feel the patient has been appropriately medically screened and is safe for discharge home. Pertinent diagnoses were discussed with the patient. Patient was given return precautions.    Milee Qualls, MD 05/21/17 [id number]

## 2017-05-21 NOTE — ED Triage Notes (Signed)
Patient complaining of abscess on left side upper mouth inside. Patient states it is painful. It started on thurs.

## 2018-10-29 ENCOUNTER — Emergency Department (HOSPITAL_COMMUNITY)
Admission: EM | Admit: 2018-10-29 | Discharge: 2018-10-29 | Disposition: A | Discharge status: Home / Self Care | Payer: BC Managed Care – PPO | Attending: Emergency Medicine | Admitting: Emergency Medicine

## 2018-10-29 ENCOUNTER — Encounter (HOSPITAL_COMMUNITY): Payer: Self-pay

## 2018-10-29 ENCOUNTER — Other Ambulatory Visit: Payer: Self-pay

## 2018-10-29 DIAGNOSIS — Z87891 Personal history of nicotine dependence: Secondary | ICD-10-CM | POA: Insufficient documentation

## 2018-10-29 DIAGNOSIS — F1210 Cannabis abuse, uncomplicated: Secondary | ICD-10-CM | POA: Insufficient documentation

## 2018-10-29 DIAGNOSIS — N751 Abscess of Bartholin's gland: Secondary | ICD-10-CM | POA: Diagnosis not present

## 2018-10-29 DIAGNOSIS — L0291 Cutaneous abscess, unspecified: Secondary | ICD-10-CM | POA: Diagnosis present

## 2018-10-29 MED ORDER — DOXYCYCLINE HYCLATE 100 MG PO CAPS: 100.0000 mg | ORAL_CAPSULE | Freq: Two times a day (BID) | ORAL | 0 refills | Status: DC

## 2018-10-29 MED ORDER — LIDOCAINE HCL (PF) 1 % IJ SOLN
5.0000 mL | Freq: Once | INTRAMUSCULAR | Status: AC
  Administered 2018-10-29: 5 mL via INTRADERMAL
  Filled 2018-10-29: qty 30

## 2018-10-29 NOTE — ED Provider Notes (Signed)
[facility] DEPT Provider Note   CSN: [id number] Arrival date & time: 10/29/18  1328     History   Chief Complaint Chief Complaint  Patient presents with  . Recurrent Skin Infections    HPI [name] [name] is a 38 y.o. female.  He has been advised to complaint vaginal abscess.  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  Patient reports she is followed by gynecology as well as her primary doctor for this.  Had a gynecology appointment couple weeks ago but is not having any symptoms.  States swelling is painful, has been going on for last couple days.     HPI  Past Medical History:  Diagnosis Date  . BV (bacterial vaginosis)   . Medical history non-contributory   . Syphilis     Patient Active Problem List   Diagnosis Date Noted  . Bacterial vaginosis 11/07/2013  . STD exposure 11/07/2013  . Single delivery by C-section 09/04/2013  . Post-operative state 09/04/2013  . Insufficient prenatal care in third trimester 07/20/2013  . 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 Supervision of normal subsequent pregnancy 04/25/2013  . Incomplete spontaneous abortion without mention of complication [id number]    Past Surgical History:  Procedure Laterality Date  . CESAREAN SECTION    . CESAREAN SECTION N/A 09/04/2013   Procedure: CESAREAN SECTION;  Surgeon: Emily Filbert, MD;  Location: [facility];  Service: Obstetrics;  Laterality: N/A;  . DILATION AND CURETTAGE OF UTERUS    . DILATION AND EVACUATION N/A 09/12/2012   Procedure: DILATATION AND EVACUATION;  Surgeon: Woodroe Mode, MD;  Location: [facility];  Service: Gynecology;  Laterality: N/A;     OB History    Gravida  6   Para  3   Term  2   Preterm  1   AB  3   Living  3     SAB  2   TAB  1   Ectopic  0   Multiple  0   Live Births  3            Home Medications    Prior to Admission medications    Medication Sig Start Date End Date Taking? Authorizing Provider  acetaminophen (TYLENOL) 500 MG tablet Take 500 mg by mouth every 6 (six) hours as needed for headache.    [provider]  doxycycline (VIBRAMYCIN) 100 MG capsule Take 1 capsule (100 mg total) by mouth 2 (two) times daily. 10/29/18   Lucrezia Starch, MD  naproxen (NAPROSYN) 500 MG tablet Take 1 tablet (500 mg total) by mouth 2 (two) times daily. 05/21/17   Palumbo, April, MD  penicillin v potassium (VEETID) 500 MG tablet Take 1 tablet (500 mg total) by mouth 3 (three) times daily. 05/21/17   Palumbo, April, MD    Family History Family History  Problem Relation Age of Onset  . Diabetes Mother   . Hypertension Mother   . Hypertension Father   . Stroke Maternal Aunt   . Hearing loss Neg Hx     Social History Social History   Tobacco Use  . Smoking status: Former Smoker    Types: Cigarettes  . Smokeless tobacco: Never Used  Substance Use Topics  . Alcohol use: Yes  . Drug use: Yes    Types: Marijuana     Allergies   Patient has no known allergies.   Review of Systems  Review of Systems  Constitutional: Negative for chills and fever.  HENT: Negative for ear pain and sore throat.   Eyes: Negative for pain and visual disturbance.  Respiratory: Negative for cough and shortness of breath.   Cardiovascular: Negative for chest pain and palpitations.  Gastrointestinal: Negative for abdominal pain and vomiting.  Genitourinary: Positive for vaginal pain. Negative for dysuria and hematuria.  Musculoskeletal: Negative for arthralgias and back pain.  Skin: Negative for color change and rash.  Neurological: Negative for seizures and syncope.  All other systems reviewed and are negative.    Physical Exam Updated Vital Signs BP (!) 149/89 (BP Location: Left Arm)   Pulse (!) 109   Temp 98.2 F (36.8 C) (Oral)   Resp 18   Ht 5\' 3"  (1.6 m)   Wt 113.4 kg   SpO2 96%   BMI 44.29 kg/m   Physical Exam  Constitutional:      Appearance: Normal appearance.  HENT:     Head: Normocephalic and atraumatic.     Mouth/Throat:     Mouth: Mucous membranes are dry.  Eyes:     Extraocular Movements: Extraocular movements intact.     Pupils: Pupils are equal, round, and reactive to light.  Cardiovascular:     Rate and Rhythm: Normal rate and regular rhythm.     Pulses: Normal pulses.  Pulmonary:     Effort: Pulmonary effort is normal. No respiratory distress.  Abdominal:     General: Abdomen is flat. Bowel sounds are normal.     Palpations: Abdomen is soft.  Musculoskeletal:        General: No swelling or tenderness.  Skin:    General: Skin is warm and dry.     Capillary Refill: Capillary refill takes less than 2 seconds.  Neurological:     General: No focal deficit present.     Mental Status: She is alert and oriented to person, place, and time.  Psychiatric:        Mood and Affect: Mood normal.        Behavior: Behavior normal.      ED Treatments / Results  Labs (all labs ordered are listed, but only abnormal results are displayed) Labs Reviewed - No data to display  EKG None  Radiology No results found.  Procedures .Marland Kitchen.Incision and Drainage  Date/Time: 10/30/2018 1:35 PM Performed by: Milagros Lollykstra, Darneisha Windhorst S, MD Authorized by: Milagros Lollykstra, Dee Paden S, MD   Consent:    Consent obtained:  Verbal   Risks discussed:  Bleeding, incomplete drainage, infection and damage to other organs   Alternatives discussed:  No treatment and delayed treatment Location:    Type:  Abscess (bartholin cyst abscess, right posterior vulva) Pre-procedure details:    Skin preparation:  Betadine Anesthesia (see MAR for exact dosages):    Anesthesia method:  Local infiltration   Local anesthetic:  Lidocaine 1% w/o epi Procedure type:    Complexity:  Simple Procedure details:    Incision types:  Stab incision   Scalpel blade:  11   Drainage:  Purulent   Drainage amount:  Moderate   Wound treatment:   Wound left open   Packing materials:  None Post-procedure details:    Patient tolerance of procedure:  Tolerated well, no immediate complications   (including critical care time)  Medications Ordered in ED Medications  lidocaine (PF) (XYLOCAINE) 1 % injection 5 mL (5 mLs Intradermal Given 10/29/18 1412)     Initial Impression / Assessment and Plan / ED Course  I have reviewed the triage vital signs and the nursing notes.  Pertinent labs & imaging results that were available during my care of the patient were reviewed by me and considered in my medical decision making (see chart for details).  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  Successful bedside I and D, given recurrence, placed on abx and recommended close GYN follow up.  I recommended placing word catheter but patient refused stating she has had bad experience previously.     After the discussed management above, the patient was determined to be safe for discharge.  The patient was in agreement with this plan and all questions regarding their care were answered.  ED return precautions were discussed and the patient will return to the ED with any significant worsening of condition.    Final Clinical Impressions(s) / ED Diagnoses   Final diagnoses:  Abscess  Bartholin's gland abscess    ED Discharge Orders         Ordered    doxycycline (VIBRAMYCIN) 100 MG capsule  2 times daily     10/29/18 1459           Milagros Lollykstra, Levell Tavano S, MD 10/30/18 1337

## 2018-10-29 NOTE — ED Triage Notes (Signed)
States this is the third time she has had a boil on a vagina states painful.

## 2018-10-29 NOTE — Discharge Instructions (Signed)
Please take antibiotic as prescribed, recommend warm compresses.  Please call your gynecologist to schedule close follow-up appointment.  Please keep your appointment with your primary doctor tomorrow.  If you develop worsening swelling, redness, fever, bleeding please return to ER for reassessment.

## 2019-01-14 ENCOUNTER — Encounter (HOSPITAL_COMMUNITY): Payer: Self-pay | Admitting: *Deleted

## 2019-01-14 ENCOUNTER — Emergency Department (HOSPITAL_COMMUNITY)
Admission: EM | Admit: 2019-01-14 | Discharge: 2019-01-14 | Disposition: A | Discharge status: Home / Self Care | Payer: BC Managed Care – PPO | Attending: Emergency Medicine | Admitting: Emergency Medicine

## 2019-01-14 ENCOUNTER — Other Ambulatory Visit: Payer: Self-pay

## 2019-01-14 DIAGNOSIS — L0291 Cutaneous abscess, unspecified: Secondary | ICD-10-CM | POA: Diagnosis present

## 2019-01-14 DIAGNOSIS — Z87891 Personal history of nicotine dependence: Secondary | ICD-10-CM | POA: Insufficient documentation

## 2019-01-14 DIAGNOSIS — F1210 Cannabis abuse, uncomplicated: Secondary | ICD-10-CM | POA: Insufficient documentation

## 2019-01-14 DIAGNOSIS — N751 Abscess of Bartholin's gland: Secondary | ICD-10-CM | POA: Insufficient documentation

## 2019-01-14 MED ORDER — LIDOCAINE-EPINEPHRINE 2 %-1:100000 IJ SOLN
20.0000 mL | Freq: Once | INTRAMUSCULAR | Status: AC
  Administered 2019-01-14: 13:00:00 20 mL via INTRADERMAL
  Filled 2019-01-14: qty 1

## 2019-01-14 MED ORDER — DOXYCYCLINE HYCLATE 100 MG PO CAPS: 100.0000 mg | ORAL_CAPSULE | Freq: Two times a day (BID) | ORAL | 0 refills | Status: DC

## 2019-01-14 NOTE — ED Notes (Signed)
Pt ambulated to bathroom without assistance 

## 2019-01-14 NOTE — ED Provider Notes (Signed)
[name] Provider Note   CSN: [id number] Arrival date & time: 01/14/19  1018     History   Chief Complaint Chief Complaint  Patient presents with  . Abscess    HPI [name] [name] is a 38 y.o. female with history of Bartholin gland abscess presents with an abscess.  Patient reports that she has been getting recurrent abscesses for the past 3 years.  It is on the right labia and in the same area as it has been in the past.  She states that she was last here in August and advised to follow-up with OB/GYN.  She states that she has not been able to do this yet but plans on calling them this week for a follow-up appointment.  She states that there was a mild poking sensation today so she knew that the abscess had recurred.  She is requesting drainage of the area and antibiotic so she can follow-up with OB/GYN next week.  She denies any fever, pelvic pain, concern for STDs.     HPI  Past Medical History:  Diagnosis Date  . BV (bacterial vaginosis)   . Medical history non-contributory   . Syphilis     Patient Active Problem List   Diagnosis Date Noted  . Bacterial vaginosis 11/07/2013  . STD exposure 11/07/2013  . Single delivery by C-section 09/04/2013  . Post-operative state 09/04/2013  . Insufficient prenatal care in third trimester 07/20/2013  . 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 Supervision of normal subsequent pregnancy 04/25/2013  . Incomplete spontaneous abortion without mention of complication [id number]    Past Surgical History:  Procedure Laterality Date  . CESAREAN SECTION    . CESAREAN SECTION N/A 09/04/2013   Procedure: CESAREAN SECTION;  Surgeon: Emily Filbert, MD;  Location: [facility];  Service: Obstetrics;  Laterality: N/A;  . DILATION AND CURETTAGE OF UTERUS    . DILATION AND EVACUATION N/A 09/12/2012   Procedure: DILATATION AND EVACUATION;  Surgeon: Woodroe Mode, MD;  Location: [facility];   Service: Gynecology;  Laterality: N/A;     OB History    Gravida  6   Para  3   Term  2   Preterm  1   AB  3   Living  3     SAB  2   TAB  1   Ectopic  0   Multiple  0   Live Births  3            Home Medications    Prior to Admission medications   Medication Sig Start Date End Date Taking? Authorizing Provider  acetaminophen (TYLENOL) 500 MG tablet Take 500 mg by mouth every 6 (six) hours as needed for headache.   Yes [provider]  dextromethorphan-guaiFENesin (MUCINEX DM) 30-600 MG 12hr tablet Take 1 tablet by mouth 2 (two) times daily as needed for cough.   Yes [provider]  doxycycline (VIBRAMYCIN) 100 MG capsule Take 1 capsule (100 mg total) by mouth 2 (two) times daily. Patient not taking: Reported on 01/14/2019 10/29/18   Lucrezia Starch, MD  naproxen (NAPROSYN) 500 MG tablet Take 1 tablet (500 mg total) by mouth 2 (two) times daily. Patient not taking: Reported on 01/14/2019 05/21/17   Palumbo, April, MD  penicillin v potassium (VEETID) 500 MG tablet Take 1 tablet (500 mg total) by mouth 3 (three) times daily. Patient not taking: Reported on 01/14/2019 05/21/17   Palumbo, April,  MD    Family History Family History  Problem Relation Age of Onset  . Diabetes Mother   . Hypertension Mother   . Hypertension Father   . Stroke Maternal Aunt   . Hearing loss Neg Hx     Social History Social History   Tobacco Use  . Smoking status: Former Smoker    Types: Cigarettes  . Smokeless tobacco: Never Used  Substance Use Topics  . Alcohol use: Yes  . Drug use: Yes    Types: Marijuana     Allergies   Patient has no known allergies.   Review of Systems Review of Systems  Constitutional: Negative for fever.  Genitourinary: Negative for pelvic pain and vaginal discharge.       +bartholin abscess  All other systems reviewed and are negative.    Physical Exam Updated Vital Signs BP 130/85 (BP Location: Left Arm)   Pulse 84    Temp 98.5 F (36.9 C) (Oral)   Resp 16   Ht 5\' 3"  (1.6 m)   Wt 117.9 kg   SpO2 100%   BMI 46.06 kg/m   Physical Exam Vitals signs and nursing note reviewed.  Constitutional:      General: She is not in acute distress.    Appearance: Normal appearance. She is well-developed. She is not ill-appearing.  HENT:     Head: Normocephalic and atraumatic.  Eyes:     General: No scleral icterus.       Right eye: No discharge.        Left eye: No discharge.     Conjunctiva/sclera: Conjunctivae normal.     Pupils: Pupils are equal, round, and reactive to light.  Neck:     Musculoskeletal: Normal range of motion.  Cardiovascular:     Rate and Rhythm: Normal rate.  Pulmonary:     Effort: Pulmonary effort is normal. No respiratory distress.  Abdominal:     General: There is no distension.  Genitourinary:    Comments: Fluctuant abscess of the right vulva consistent with bartholin gland abscess. Chaperone present during exam Skin:    General: Skin is warm and dry.  Neurological:     Mental Status: She is alert and oriented to person, place, and time.  Psychiatric:        Behavior: Behavior normal.      ED Treatments / Results  Labs (all labs ordered are listed, but only abnormal results are displayed) Labs Reviewed - No data to display  EKG None  Radiology No results found.  Procedures . Incision and Drainage  Date/Time: 01/14/2019 3:01 PM Performed by: 13/10/2018, PA-C Authorized by: Bethel Born, PA-C   Consent:    Consent obtained:  Verbal   Consent given by:  Patient   Risks discussed:  Bleeding, incomplete drainage, pain and damage to other organs   Alternatives discussed:  No treatment Universal protocol:    Procedure explained and questions answered to patient or proxy's satisfaction: yes     Relevant documents present and verified: yes     Test results available and properly labeled: yes     Imaging studies available: yes     Required blood  products, implants, devices, and special equipment available: yes     Site/side marked: yes     Immediately prior to procedure a time out was called: yes     Patient identity confirmed:  Verbally with patient Location:    Type:  Bartholin cyst   Size:  3x3  Location:  Anogenital   Anogenital location:  Bartholin's gland Pre-procedure details:    Skin preparation:  Betadine Anesthesia (see MAR for exact dosages):    Anesthesia method:  Local infiltration   Local anesthetic:  Lidocaine 2% WITH epi Procedure type:    Complexity:  Simple Procedure details:    Incision types:  Single straight   Incision depth:  Subcutaneous   Scalpel blade:  11   Wound management:  Probed and deloculated, irrigated with saline and extensive cleaning   Drainage:  Purulent and bloody   Drainage amount:  Moderate   Packing materials:  None Post-procedure details:    Patient tolerance of procedure:  Tolerated well, no immediate complications   (including critical care time)    Medications Ordered in ED Medications  lidocaine-EPINEPHrine (XYLOCAINE W/EPI) 2 %-1:100000 (with pres) injection 20 mL (has no administration in time range)     Initial Impression / Assessment and Plan / ED Course  I have reviewed the triage vital signs and the nursing notes.  Pertinent labs & imaging results that were available during my care of the patient were reviewed by me and considered in my medical decision making (see chart for details).  38 year old presents with a simple bartholin gland abscess which is amenable to I&D. I&D performed and patient tolerated very well. Patient is afebrile. Discussed wound care and signs of infection (fever, chills, increasing pain, redness, or drainage at site). She has had this many times in the past. She was given rx for Doxy and advised to f/u with OBGYN   Final Clinical Impressions(s) / ED Diagnoses   Final diagnoses:  Bartholin's gland abscess    ED Discharge Orders          Ordered    doxycycline (VIBRAMYCIN) 100 MG capsule  2 times daily     01/14/19 1337           Bethel BornGekas, Kelly Marie, PA-C 01/14/19 1503    Virgina Norfolkuratolo, Adam, DO 01/14/19 1507

## 2019-01-14 NOTE — ED Notes (Signed)
Gakes PA at bedside

## 2019-01-14 NOTE — ED Notes (Signed)
Pt verbalizes understanding of DC instructions. Pt belongings returned and is ambulatory out of ED.  

## 2019-01-14 NOTE — ED Triage Notes (Signed)
Labia abscess, has a history

## 2019-01-14 NOTE — ED Notes (Signed)
Incision and drainage tray set up at bedside

## 2019-01-14 NOTE — Discharge Instructions (Signed)
Take Doxy twice daily for the next week Please follow up with OBGYN

## 2019-05-06 IMAGING — CR DG ANKLE COMPLETE 3+V*R*
3 series · 3 of 3 positions shown · non-contrast
Comparison: None

CLINICAL DATA: Dancing in yard yesterday with son, slipped, twisted
and rolled RIGHT ankle, pain and soft tissue swelling laterally

EXAM:
RIGHT ANKLE - COMPLETE 3+ VIEW

[x ankle ap right]
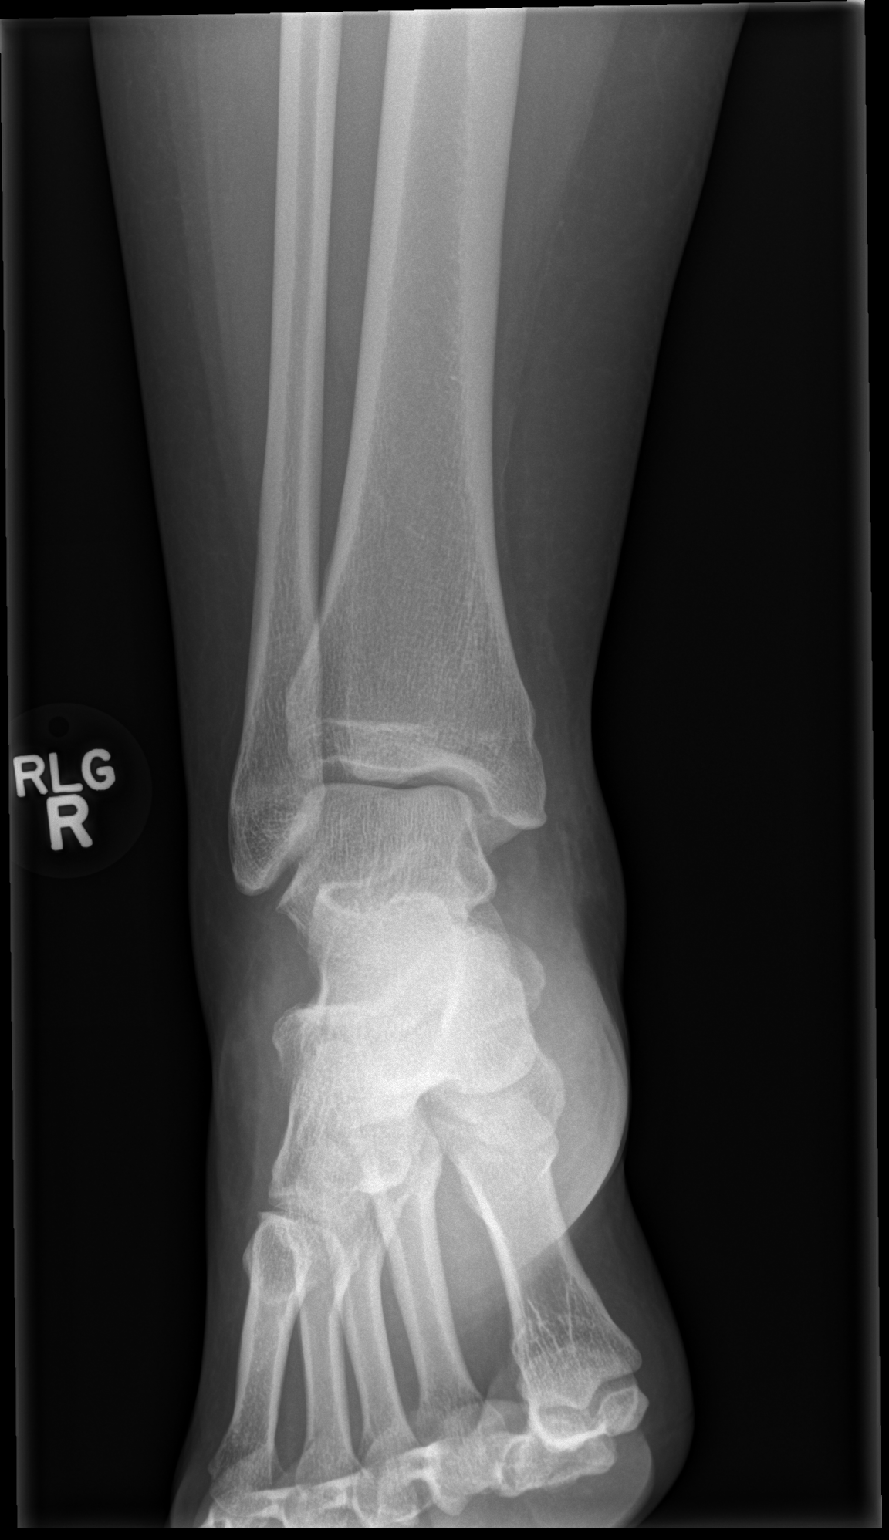

[x ankle obl right]
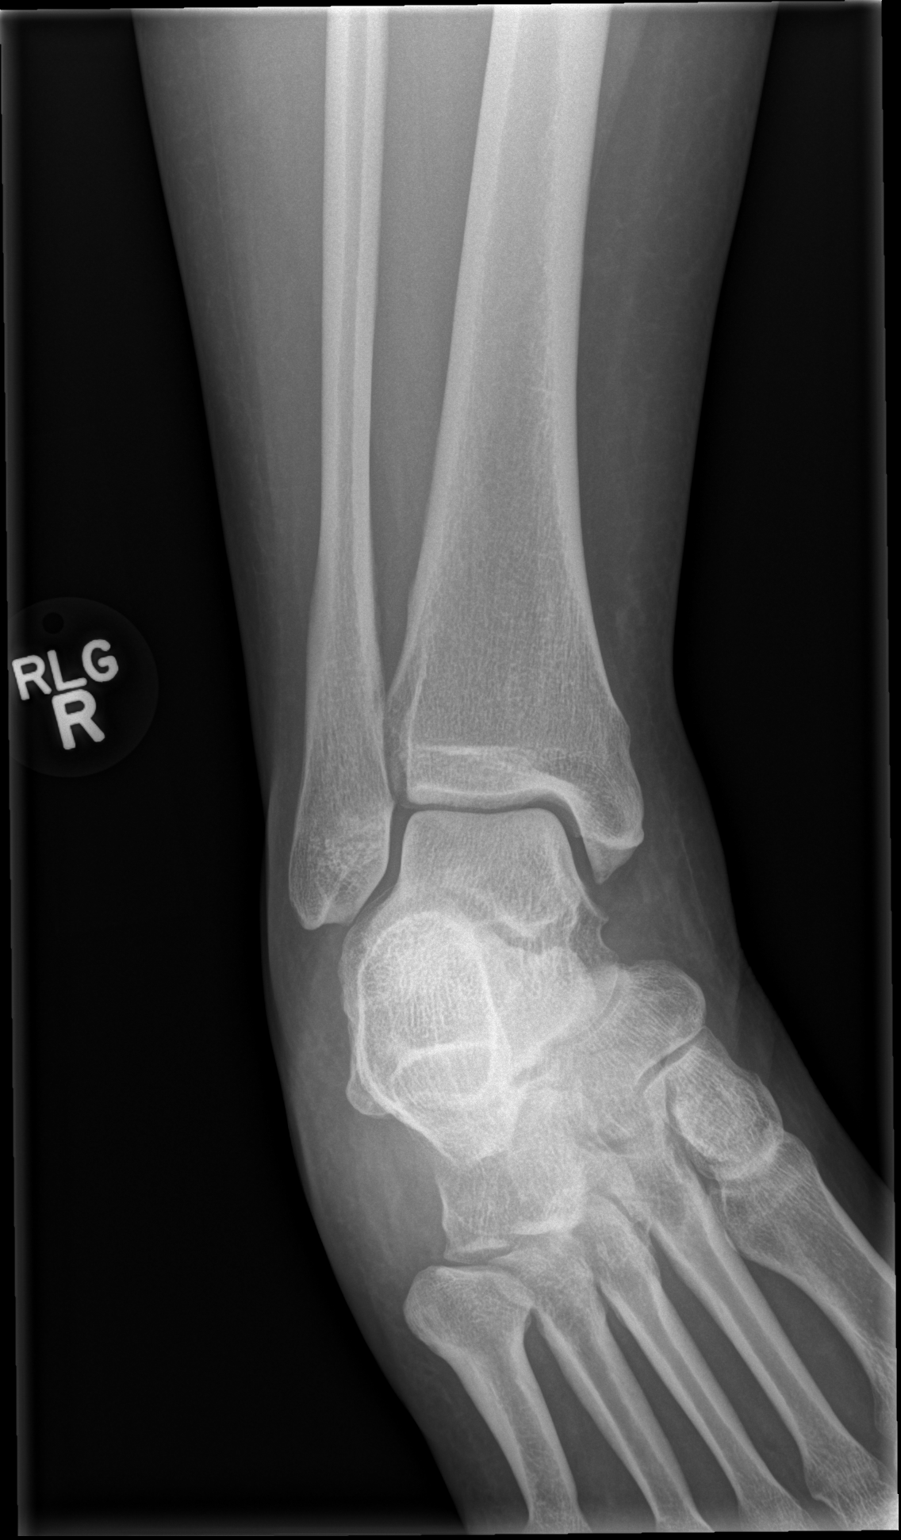

[x ankle lat right]
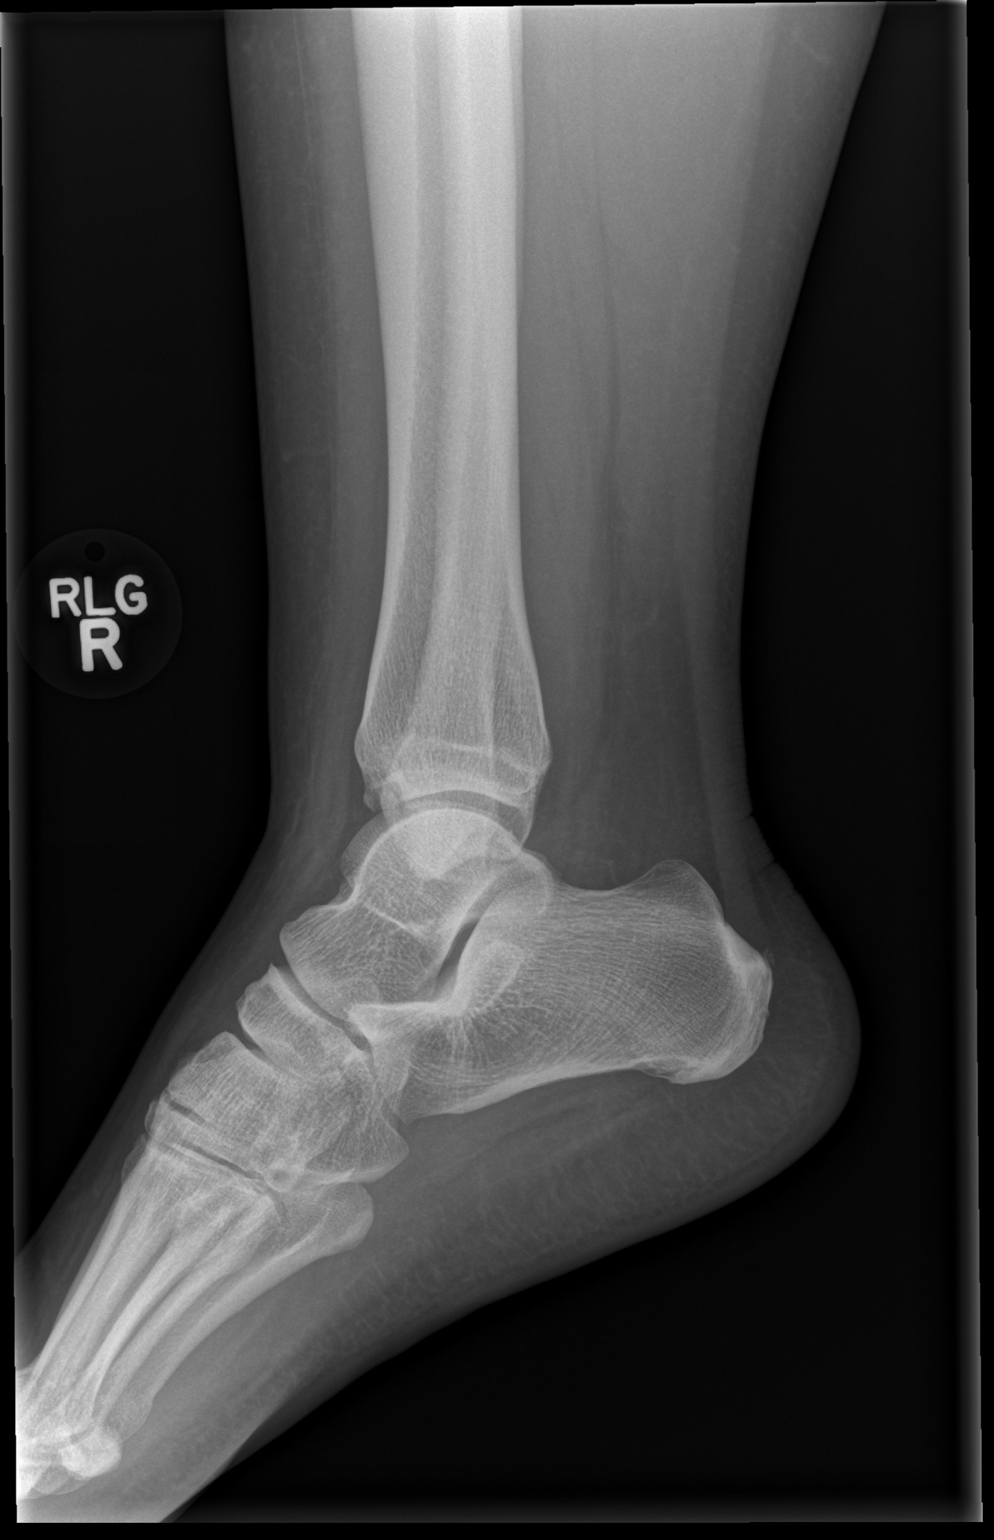

[3 of 3 positions shown; findings below may reference images not displayed]

FINDINGS: Soft tissue swelling diffusely RIGHT ankle.

Osseous mineralization normal.

Ankle joint space preserved.

No acute fracture, dislocation, or bone destruction.
IMPRESSION: No acute osseous abnormalities.

## 2019-12-05 ENCOUNTER — Emergency Department (HOSPITAL_COMMUNITY)
Admission: EM | Admit: 2019-12-05 | Discharge: 2019-12-06 | Disposition: A | Discharge status: Home / Self Care | Payer: BC Managed Care – PPO | Attending: Emergency Medicine | Admitting: Emergency Medicine

## 2019-12-05 ENCOUNTER — Encounter (HOSPITAL_COMMUNITY): Payer: Self-pay

## 2019-12-05 ENCOUNTER — Other Ambulatory Visit: Payer: Self-pay

## 2019-12-05 DIAGNOSIS — Z87891 Personal history of nicotine dependence: Secondary | ICD-10-CM | POA: Insufficient documentation

## 2019-12-05 DIAGNOSIS — B9689 Other specified bacterial agents as the cause of diseases classified elsewhere: Secondary | ICD-10-CM

## 2019-12-05 DIAGNOSIS — N760 Acute vaginitis: Secondary | ICD-10-CM

## 2019-12-05 DIAGNOSIS — Z202 Contact with and (suspected) exposure to infections with a predominantly sexual mode of transmission: Secondary | ICD-10-CM

## 2019-12-05 LAB — URINALYSIS, ROUTINE W REFLEX MICROSCOPIC
Bilirubin Urine: NEGATIVE
Glucose, UA: NEGATIVE mg/dL
Ketones, ur: NEGATIVE mg/dL
Leukocytes,Ua: NEGATIVE
Nitrite: NEGATIVE
Protein, ur: NEGATIVE mg/dL
Specific Gravity, Urine: 1.019 (ref 1.005–1.030)
pH: 6 (ref 5.0–8.0)

## 2019-12-05 NOTE — ED Triage Notes (Signed)
Pt notified of partner positive for std.

## 2019-12-06 LAB — WET PREP, GENITAL
Sperm: NONE SEEN
Trich, Wet Prep: NONE SEEN
WBC, Wet Prep HPF POC: NONE SEEN
Yeast Wet Prep HPF POC: NONE SEEN

## 2019-12-06 LAB — HIV ANTIBODY (ROUTINE TESTING W REFLEX): HIV Screen 4th Generation wRfx: NONREACTIVE

## 2019-12-06 LAB — GC/CHLAMYDIA PROBE AMP (~~LOC~~) NOT AT ARMC
Chlamydia: NEGATIVE
Comment: NEGATIVE
Comment: NORMAL
Neisseria Gonorrhea: NEGATIVE

## 2019-12-06 LAB — PREGNANCY, URINE: Preg Test, Ur: NEGATIVE

## 2019-12-06 LAB — RPR: RPR Ser Ql: NONREACTIVE

## 2019-12-06 MED ORDER — METRONIDAZOLE 0.75 % VA GEL: 1.0000 | Freq: Two times a day (BID) | VAGINAL | 0 refills | Status: DC

## 2019-12-06 MED ORDER — CEFTRIAXONE SODIUM 1 G IJ SOLR
500.0000 mg | Freq: Once | INTRAMUSCULAR | Status: AC
  Administered 2019-12-06: 500 mg via INTRAMUSCULAR
  Filled 2019-12-06: qty 10

## 2019-12-06 MED ORDER — DOXYCYCLINE HYCLATE 100 MG PO TABS
100.0000 mg | ORAL_TABLET | Freq: Once | ORAL | Status: AC
  Administered 2019-12-06: 100 mg via ORAL
  Filled 2019-12-06: qty 1

## 2019-12-06 MED ORDER — STERILE WATER FOR INJECTION IJ SOLN
INTRAMUSCULAR | Status: AC
  Administered 2019-12-06: 10 mL
  Filled 2019-12-06: qty 10

## 2019-12-06 MED ORDER — DOXYCYCLINE HYCLATE 100 MG PO CAPS: 100.0000 mg | ORAL_CAPSULE | Freq: Two times a day (BID) | ORAL | 0 refills | Status: DC

## 2019-12-06 NOTE — Discharge Instructions (Signed)
1. Medications: doxycycline, metrogel, usual home medications 2. Treatment: rest, drink plenty of fluids, use a condom with every sexual encounter 3. Follow Up: Please followup with your primary doctor in 5-7 days for discussion of your diagnoses and further evaluation after today's visit; Please return to the ER for worsening symptoms, high fevers or persistent vomiting.  You have been tested for HIV, syphilis, chlamydia and gonorrhea.  These results will be available in approximately 3 days.  Please inform all sexual partners if you test positive for any of these diseases.

## 2019-12-06 NOTE — ED Provider Notes (Signed)
[facility]-EMERGENCY DEPT Provider Note   CSN: [id number] Arrival date & time: 12/05/19  1840     History Chief Complaint  Patient presents with  . Exposure to STD    [name] [name] is a 39 y.o. female with a hx of bacterial vaginosis, syphilis presents to the Emergency Department for evaluation and treatment after being informed by a former partner that she had been exposed to chlamydia.  Patient reports she was informed several hours prior to arrival.  She reports she is currently menstruating however has had some more intense abdominal pain over the last several days.  Unknown vaginal discharge given menstruation status.  She denies fevers, chills, headache, neck pain, chest pain, nausea, vomiting, diarrhea, weakness, dizziness, syncope.  Patient reports being treated for syphilis in 2009.  Does wish to have full STD panel today.  No other complaints.  The history is provided by the patient and medical records. No language interpreter was used.       Past Medical History:  Diagnosis Date  . BV (bacterial vaginosis)   . Medical history non-contributory   . Syphilis     Patient Active Problem List   Diagnosis Date Noted  . Bacterial vaginosis 11/07/2013  . STD exposure 11/07/2013  . Single delivery by C-section 09/04/2013  . Post-operative state 09/04/2013  . Insufficient prenatal care in third trimester 07/20/2013  . 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 Supervision of normal subsequent pregnancy 04/25/2013  . Incomplete spontaneous abortion without mention of complication 09/13/2012    Past Surgical History:  Procedure Laterality Date  . CESAREAN SECTION    . CESAREAN SECTION N/A 09/04/2013   Procedure: CESAREAN SECTION;  Surgeon: Allie Bossier, MD;  Location: [facility];  Service: Obstetrics;  Laterality: N/A;  . DILATION AND CURETTAGE OF UTERUS    . DILATION AND EVACUATION N/A 09/12/2012   Procedure: DILATATION AND  EVACUATION;  Surgeon: Adam Phenix, MD;  Location: [facility];  Service: Gynecology;  Laterality: N/A;     OB History    Gravida  6   Para  3   Term  2   Preterm  1   AB  3   Living  3     SAB  2   TAB  1   Ectopic  0   Multiple  0   Live Births  3           Family History  Problem Relation Age of Onset  . Diabetes Mother   . Hypertension Mother   . Hypertension Father   . Stroke Maternal Aunt   . Hearing loss Neg Hx     Social History   Tobacco Use  . Smoking status: Former Smoker    Types: Cigarettes  . Smokeless tobacco: Never Used  Substance Use Topics  . Alcohol use: Yes  . Drug use: Yes    Types: Marijuana    Home Medications Prior to Admission medications   Medication Sig Start Date End Date Taking? Authorizing Provider  acetaminophen (TYLENOL) 500 MG tablet Take 500 mg by mouth every 6 (six) hours as needed for headache.    [provider]  dextromethorphan-guaiFENesin (MUCINEX DM) 30-600 MG 12hr tablet Take 1 tablet by mouth 2 (two) times daily as needed for cough.    [provider]  doxycycline (VIBRAMYCIN) 100 MG capsule Take 1 capsule (100 mg total) by mouth 2 (two) times daily. 12/06/19   Colsen Modi, Dahlia Client, PA-C  metroNIDAZOLE (METROGEL VAGINAL) 0.75 % vaginal gel Place 1 Applicatorful vaginally 2 (two) times daily. 12/06/19   Linetta Regner, Dahlia Client, PA-C    Allergies    Patient has no known allergies.  Review of Systems   Review of Systems  Constitutional: Negative for appetite change, diaphoresis, fatigue, fever and unexpected weight change.  HENT: Negative for mouth sores.   Eyes: Negative for visual disturbance.  Respiratory: Negative for cough, chest tightness, shortness of breath and wheezing.   Cardiovascular: Negative for chest pain.  Gastrointestinal: Positive for abdominal pain. Negative for constipation, diarrhea, nausea and vomiting.  Endocrine: Negative for polydipsia, polyphagia and polyuria.    Genitourinary: Negative for dysuria, frequency, hematuria and urgency.  Musculoskeletal: Negative for back pain and neck stiffness.  Skin: Negative for rash.  Allergic/Immunologic: Negative for immunocompromised state.  Neurological: Negative for syncope, light-headedness and headaches.  Hematological: Does not bruise/bleed easily.  Psychiatric/Behavioral: Negative for sleep disturbance. The patient is not nervous/anxious.     Physical Exam Updated Vital Signs BP 129/82 (BP Location: Left Arm)   Pulse 100   Temp 98.6 F (37 C) (Oral)   Resp 16   Ht 5\' 4"  (1.626 m)   Wt 117.9 kg   SpO2 99%   BMI 44.63 kg/m   Physical Exam Vitals and nursing note reviewed.  Constitutional:      General: She is not in acute distress.    Appearance: She is not diaphoretic.  HENT:     Head: Normocephalic.  Eyes:     General: No scleral icterus.    Conjunctiva/sclera: Conjunctivae normal.  Cardiovascular:     Rate and Rhythm: Normal rate and regular rhythm.     Pulses: Normal pulses.          Radial pulses are 2+ on the right side and 2+ on the left side.  Pulmonary:     Effort: No tachypnea, accessory muscle usage, prolonged expiration, respiratory distress or retractions.     Breath sounds: No stridor.     Comments: Equal chest rise. No increased work of breathing. Abdominal:     General: There is no distension.     Palpations: Abdomen is soft.     Tenderness: There is no abdominal tenderness. There is no guarding or rebound.  Musculoskeletal:     Cervical back: Normal range of motion.     Comments: Moves all extremities equally and without difficulty.  Skin:    General: Skin is warm and dry.     Capillary Refill: Capillary refill takes less than 2 seconds.  Neurological:     Mental Status: She is alert.     GCS: GCS eye subscore is 4. GCS verbal subscore is 5. GCS motor subscore is 6.     Comments: Speech is clear and goal oriented.  Psychiatric:        Mood and Affect: Mood  normal.     ED Results / Procedures / Treatments   Labs (all labs ordered are listed, but only abnormal results are displayed) Labs Reviewed  WET PREP, GENITAL - Abnormal; Notable for the following components:      Result Value   Clue Cells Wet Prep HPF POC PRESENT (*)    All other components within normal limits  URINALYSIS, ROUTINE W REFLEX MICROSCOPIC - Abnormal; Notable for the following components:   APPearance HAZY (*)    Hgb urine dipstick LARGE (*)    Bacteria, UA RARE (*)    All other components within normal limits  PREGNANCY,  URINE  RPR  HIV ANTIBODY (ROUTINE TESTING W REFLEX)  GC/CHLAMYDIA PROBE AMP ([place]) NOT AT [facility]    Procedures Procedures (including critical care time)  Medications Ordered in ED Medications  doxycycline (VIBRA-TABS) tablet 100 mg (has no administration in time range)  cefTRIAXone (ROCEPHIN) injection 500 mg (500 mg Intramuscular Given 12/06/19 0109)  sterile water (preservative free) injection (10 mLs  Given 12/06/19 0109)    ED Course  I have reviewed the triage vital signs and the nursing notes.  Pertinent labs & imaging results that were available during my care of the patient were reviewed by me and considered in my medical decision making (see chart for details).  Clinical Course as of Dec 05 120  Thu Dec 06, 2019  0035 Pt currently menstruating  Hgb urine dipstick(!): LARGE [HM]  0048 Borderline tachycardia however no tachycardia on my exam.  Pulse Rate: 100 [HM]    Clinical Course User Index [HM] Rosie Golson, Boyd Kerbs   MDM Rules/Calculators/A&P                          Patient presents with return for STD exposure, specifically chlamydia.  Normal vital signs and patient without systemic symptoms.  Will allow for self swab.  UA does have hemoglobin however patient is currently menstruating.  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  We will treat with MetroGel.  Gonorrhea, chlamydia,  HIV and RPR are pending.  Patient treated prophylactically here in the emergency department with Rocephin and doxycycline.  Will be discharged home with Doxy prescription.  Discussed follow-up with primary care physician and access of test results in my chart.  Patient states understanding and is in agreement with the plan.   Final Clinical Impression(s) / ED Diagnoses Final diagnoses:  STD exposure  Bacterial vaginosis    Rx / DC Orders ED Discharge Orders         Ordered    doxycycline (VIBRAMYCIN) 100 MG capsule  2 times daily        12/06/19 0120    metroNIDAZOLE (METROGEL VAGINAL) 0.75 % vaginal gel  2 times daily        12/06/19 0120           Florice Hindle, Boyd Kerbs 12/06/19 0124    Dione Booze, MD 12/06/19 0425

## 2020-07-28 ENCOUNTER — Other Ambulatory Visit: Payer: Self-pay

## 2020-07-28 ENCOUNTER — Encounter (HOSPITAL_COMMUNITY): Payer: Self-pay | Admitting: Emergency Medicine

## 2020-07-28 ENCOUNTER — Emergency Department (HOSPITAL_COMMUNITY)
Admission: EM | Admit: 2020-07-28 | Discharge: 2020-07-28 | Discharge status: Against Medical Advice | Payer: BC Managed Care – PPO | Attending: Emergency Medicine | Admitting: Emergency Medicine

## 2020-07-28 DIAGNOSIS — A599 Trichomoniasis, unspecified: Secondary | ICD-10-CM | POA: Insufficient documentation

## 2020-07-28 DIAGNOSIS — N760 Acute vaginitis: Secondary | ICD-10-CM | POA: Insufficient documentation

## 2020-07-28 DIAGNOSIS — Z87891 Personal history of nicotine dependence: Secondary | ICD-10-CM | POA: Insufficient documentation

## 2020-07-28 DIAGNOSIS — B9689 Other specified bacterial agents as the cause of diseases classified elsewhere: Secondary | ICD-10-CM | POA: Diagnosis not present

## 2020-07-28 LAB — WET PREP, GENITAL
Clue Cells Wet Prep HPF POC: NONE SEEN
Sperm: NONE SEEN
Yeast Wet Prep HPF POC: NONE SEEN

## 2020-07-28 LAB — URINALYSIS, ROUTINE W REFLEX MICROSCOPIC
Bacteria, UA: NONE SEEN
Bilirubin Urine: NEGATIVE
Glucose, UA: NEGATIVE mg/dL
Hgb urine dipstick: NEGATIVE
Ketones, ur: NEGATIVE mg/dL
Nitrite: NEGATIVE
Protein, ur: NEGATIVE mg/dL
Specific Gravity, Urine: 1.021 (ref 1.005–1.030)
Squamous Epithelial / HPF: >50 — ABNORMAL HIGH (ref 0–5)
pH: 7 (ref 5.0–8.0)

## 2020-07-28 MED ORDER — METRONIDAZOLE 500 MG PO TABS: 500.0000 mg | ORAL_TABLET | Freq: Two times a day (BID) | ORAL | 0 refills | Status: AC

## 2020-07-28 NOTE — ED Notes (Signed)
Pt has not returned to room. EDP advised for AMA/discharge.

## 2020-07-28 NOTE — ED Triage Notes (Signed)
Pt here with c/ lower abd pain , vag discharge with odor , pt has had BV in the past

## 2020-07-28 NOTE — ED Notes (Signed)
Pt approached [employer] operational officer asking for this RN. This RN was giving report for another patient and this patient acknowledged that. Upon getting off the phone the secretary reports that the patient left specimens in the room and departed. Currently investigating whether or not the patient has left AMA

## 2020-07-28 NOTE — Discharge Instructions (Signed)
Return if any problems.  See your Physician for recheck  °

## 2020-07-28 NOTE — ED Provider Notes (Signed)
[facility] EMERGENCY DEPARTMENT Provider Note   CSN: [id number] Arrival date & time: 07/28/20  1234     History No chief complaint on file.   [name] [name] is a 40 y.o. female.  The history is provided by the patient. No language interpreter was used.  Vaginal Discharge Quality:  White Severity:  Moderate Onset quality:  Gradual Timing:  Constant Relieved by:  Nothing Worsened by:  Nothing Associated symptoms: vaginal itching   Associated symptoms: no abdominal pain        Past Medical History:  Diagnosis Date  . BV (bacterial vaginosis)   . Medical history non-contributory   . Syphilis     Patient Active Problem List   Diagnosis Date Noted  . Bacterial vaginosis 11/07/2013  . STD exposure 11/07/2013  . Single delivery by C-section 09/04/2013  . Post-operative state 09/04/2013  . Insufficient prenatal care in third trimester 07/20/2013  . 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 Supervision of normal subsequent pregnancy 04/25/2013  . Incomplete spontaneous abortion without mention of complication 09/13/2012    Past Surgical History:  Procedure Laterality Date  . CESAREAN SECTION    . CESAREAN SECTION N/A 09/04/2013   Procedure: CESAREAN SECTION;  Surgeon: Allie Bossier, MD;  Location: [facility];  Service: Obstetrics;  Laterality: N/A;  . DILATION AND CURETTAGE OF UTERUS    . DILATION AND EVACUATION N/A 09/12/2012   Procedure: DILATATION AND EVACUATION;  Surgeon: Adam Phenix, MD;  Location: [facility];  Service: Gynecology;  Laterality: N/A;     OB History    Gravida  6   Para  3   Term  2   Preterm  1   AB  3   Living  3     SAB  2   IAB  1   Ectopic  0   Multiple  0   Live Births  3           Family History  Problem Relation Age of Onset  . Diabetes Mother   . Hypertension Mother   . Hypertension Father   . Stroke Maternal Aunt   . Hearing loss Neg Hx     Social History   Tobacco  Use  . Smoking status: Former Smoker    Types: Cigarettes  . Smokeless tobacco: Never Used  Substance Use Topics  . Alcohol use: Yes  . Drug use: Yes    Types: Marijuana    Home Medications Prior to Admission medications   Medication Sig Start Date End Date Taking? Authorizing Provider  Multiple Vitamins-Minerals (HAIR/SKIN/NAILS/BIOTIN PO) Take 1 tablet by mouth daily.   Yes [provider]  metroNIDAZOLE (METROGEL VAGINAL) 0.75 % vaginal gel Place 1 Applicatorful vaginally 2 (two) times daily. Patient not taking: No sig reported 12/06/19   Muthersbaugh, Dahlia Client, PA-C    Allergies    Patient has no known allergies.  Review of Systems   Review of Systems  Gastrointestinal: Negative for abdominal pain.  Genitourinary: Positive for vaginal discharge.  All other systems reviewed and are negative.   Physical Exam Updated Vital Signs BP (!) 143/78 (BP Location: Right Arm)   Pulse (!) 107   Temp 98.9 F (37.2 C) (Oral)   Resp 17   SpO2 98%   Physical Exam Constitutional:      Appearance: She is well-developed.  HENT:     Head: Normocephalic and atraumatic.  Eyes:     Conjunctiva/sclera: Conjunctivae normal.  Pupils: Pupils are equal, round, and reactive to light.  Cardiovascular:     Rate and Rhythm: Normal rate.  Abdominal:     Palpations: Abdomen is soft.     Tenderness: There is no abdominal tenderness.  Genitourinary:    Vagina: Vaginal discharge present.     Comments: Vaginal discharge,  Thick white,  Adnexa no masses,  Cervix nontender Musculoskeletal:        General: Normal range of motion.     Cervical back: Normal range of motion and neck supple.  Skin:    General: Skin is warm.     ED Results / Procedures / Treatments   Labs (all labs ordered are listed, but only abnormal results are displayed) Labs Reviewed  WET PREP, GENITAL - Abnormal; Notable for the following components:      Result Value   Trich, Wet Prep PRESENT (*)    WBC, Wet  Prep HPF POC MODERATE (*)    All other components within normal limits  URINALYSIS, ROUTINE W REFLEX MICROSCOPIC - Abnormal; Notable for the following components:   APPearance CLOUDY (*)    Leukocytes,Ua SMALL (*)    Squamous Epithelial / LPF >50 (*)    Trichomonas, UA PRESENT (*)    All other components within normal limits  GC/CHLAMYDIA PROBE AMP ([place]) NOT AT [facility]    EKG None  Radiology No results found.  Procedures Procedures   Medications Ordered in ED Medications - No data to display  ED Course  I have reviewed the triage vital signs and the nursing notes.  Pertinent labs & imaging results that were available during my care of the patient were reviewed by me and considered in my medical decision making (see chart for details).    MDM Rules/Calculators/A&P                          MDM:  Wet prep shows trich and clue cells.  Pt given rx for flagyl.  Pt advised to return if any problems.  Follow up with primary care.  Final Clinical Impression(s) / ED Diagnoses Final diagnoses:  Trichomoniasis  BV (bacterial vaginosis)    Rx / DC Orders ED Discharge Orders         Ordered    metroNIDAZOLE (FLAGYL) 500 MG tablet  2 times daily        07/28/20 1705        An After Visit Summary was printed and given to the patient.    Elson Areas, [place] 07/28/20 1706    Margarita Grizzle, MD 07/28/20 [phone]

## 2020-07-29 LAB — GC/CHLAMYDIA PROBE AMP (~~LOC~~) NOT AT ARMC
Chlamydia: NEGATIVE
Comment: NEGATIVE
Comment: NORMAL
Neisseria Gonorrhea: NEGATIVE

## 2020-08-27 ENCOUNTER — Emergency Department (HOSPITAL_COMMUNITY)
Admission: EM | Admit: 2020-08-27 | Discharge: 2020-08-27 | Disposition: A | Discharge status: Home / Self Care | Payer: BC Managed Care – PPO | Attending: Emergency Medicine | Admitting: Emergency Medicine

## 2020-08-27 ENCOUNTER — Encounter (HOSPITAL_COMMUNITY): Payer: Self-pay | Admitting: *Deleted

## 2020-08-27 ENCOUNTER — Other Ambulatory Visit: Payer: Self-pay

## 2020-08-27 DIAGNOSIS — Z87891 Personal history of nicotine dependence: Secondary | ICD-10-CM | POA: Insufficient documentation

## 2020-08-27 DIAGNOSIS — R102 Pelvic and perineal pain: Secondary | ICD-10-CM | POA: Diagnosis present

## 2020-08-27 DIAGNOSIS — N760 Acute vaginitis: Secondary | ICD-10-CM | POA: Insufficient documentation

## 2020-08-27 DIAGNOSIS — B9689 Other specified bacterial agents as the cause of diseases classified elsewhere: Secondary | ICD-10-CM | POA: Insufficient documentation

## 2020-08-27 LAB — CBC
HCT: 35.4 % — ABNORMAL LOW (ref 36.0–46.0)
Hemoglobin: 11.6 g/dL — ABNORMAL LOW (ref 12.0–15.0)
MCH: 31.2 pg (ref 26.0–34.0)
MCHC: 32.8 g/dL (ref 30.0–36.0)
MCV: 95.2 fL (ref 80.0–100.0)
Platelets: 333 10*3/uL (ref 150–400)
RBC: 3.72 MIL/uL — ABNORMAL LOW (ref 3.87–5.11)
RDW: 13.1 % (ref 11.5–15.5)
WBC: 6.9 10*3/uL (ref 4.0–10.5)
nRBC: 0 % (ref 0.0–0.2)

## 2020-08-27 LAB — COMPREHENSIVE METABOLIC PANEL
ALT: 20 U/L (ref 0–44)
AST: 15 U/L (ref 15–41)
Albumin: 3.7 g/dL (ref 3.5–5.0)
Alkaline Phosphatase: 79 U/L (ref 38–126)
Anion gap: 7 (ref 5–15)
BUN: 12 mg/dL (ref 6–20)
CO2: 25 mmol/L (ref 22–32)
Calcium: 8.5 mg/dL — ABNORMAL LOW (ref 8.9–10.3)
Chloride: 107 mmol/L (ref 98–111)
Creatinine, Ser: 0.64 mg/dL (ref 0.44–1.00)
GFR, Estimated: >60 mL/min (ref >60)
Glucose, Bld: 85 mg/dL (ref 70–99)
Potassium: 3.5 mmol/L (ref 3.5–5.1)
Sodium: 139 mmol/L (ref 135–145)
Total Bilirubin: 0.4 mg/dL (ref 0.3–1.2)
Total Protein: 7.2 g/dL (ref 6.5–8.1)

## 2020-08-27 LAB — URINALYSIS, ROUTINE W REFLEX MICROSCOPIC
Bilirubin Urine: NEGATIVE
Glucose, UA: NEGATIVE mg/dL
Hgb urine dipstick: NEGATIVE
Ketones, ur: NEGATIVE mg/dL
Leukocytes,Ua: NEGATIVE
Nitrite: NEGATIVE
Protein, ur: NEGATIVE mg/dL
Specific Gravity, Urine: 1.025 (ref 1.005–1.030)
pH: 6 (ref 5.0–8.0)

## 2020-08-27 LAB — WET PREP, GENITAL
Sperm: NONE SEEN
Trich, Wet Prep: NONE SEEN
Yeast Wet Prep HPF POC: NONE SEEN

## 2020-08-27 LAB — I-STAT BETA HCG BLOOD, ED (MC, WL, AP ONLY): I-stat hCG, quantitative: <5 m[IU]/mL (ref <5)

## 2020-08-27 LAB — LIPASE, BLOOD: Lipase: 92 U/L — ABNORMAL HIGH (ref 11–51)

## 2020-08-27 MED ORDER — METRONIDAZOLE 500 MG PO TABS: 500.0000 mg | ORAL_TABLET | Freq: Two times a day (BID) | ORAL | 0 refills | Status: DC

## 2020-08-27 NOTE — ED Triage Notes (Signed)
Pt complains of abdominal pain x 4-5 days. Symptoms started after vaginal intercourse. She said she is prone to BV.

## 2020-08-27 NOTE — ED Provider Notes (Signed)
[facility] EMERGENCY DEPARTMENT Provider Note  CSN: [id number] Arrival date & time: 08/27/20 1829    History Chief Complaint  Patient presents with   Abdominal Pain     Abdominal Pain  [name] [name] is a 40 y.o. female with prior history of BV as well as remote treatment for chlamydia and syphilis reports she had unprotected sex with a new partner on Saturday (4 days ago). 2 days ago she began having some mild cramping L pelvic pain which she thought might be her menstrual cycle starting. She was not concerned about that discomfort until this afternoon when she noted that she had a malodorous vaginal discharge. No fever, vomiting, diarrhea, or vaginal bleeding. She does not think she is pregnant.    Past Medical History:  Diagnosis Date   BV (bacterial vaginosis)    Medical history non-contributory    Syphilis     Past Surgical History:  Procedure Laterality Date   CESAREAN SECTION     CESAREAN SECTION N/A 09/04/2013   Procedure: CESAREAN SECTION;  Surgeon: Allie Bossier, MD;  Location: [facility];  Service: Obstetrics;  Laterality: N/A;   DILATION AND CURETTAGE OF UTERUS     DILATION AND EVACUATION N/A 09/12/2012   Procedure: DILATATION AND EVACUATION;  Surgeon: Adam Phenix, MD;  Location: [facility];  Service: Gynecology;  Laterality: N/A;    Family History  Problem Relation Age of Onset   Diabetes Mother    Hypertension Mother    Hypertension Father    Stroke Maternal Aunt    Hearing loss Neg Hx     Social History   Tobacco Use   Smoking status: Former    Pack years: 0.00    Types: Cigarettes   Smokeless tobacco: Never  Substance Use Topics   Alcohol use: Yes   Drug use: Yes    Types: Marijuana     Home Medications Prior to Admission medications   Medication Sig Start Date End Date Taking? Authorizing Provider  metroNIDAZOLE (FLAGYL) 500 MG tablet Take 1 tablet (500 mg total) by mouth 2 (two) times daily. 08/27/20  Yes Pollyann Savoy, MD  Multiple  Vitamins-Minerals (HAIR/SKIN/NAILS/BIOTIN PO) Take 1 tablet by mouth daily.    [provider]     Allergies    Patient has no known allergies.   Review of Systems   Review of Systems  Gastrointestinal:  Positive for abdominal pain.  A comprehensive review of systems was completed and negative except as noted in HPI.    Physical Exam BP 125/81   Pulse 72   Temp 98.3 F (36.8 C) (Oral)   Resp 15   SpO2 100%   Physical Exam Vitals and nursing note reviewed.  Constitutional:      Appearance: Normal appearance.  HENT:     Head: Normocephalic and atraumatic.     Nose: Nose normal.     Mouth/Throat:     Mouth: Mucous membranes are moist.  Eyes:     Extraocular Movements: Extraocular movements intact.     Conjunctiva/sclera: Conjunctivae normal.  Cardiovascular:     Rate and Rhythm: Normal rate.  Pulmonary:     Effort: Pulmonary effort is normal.     Breath sounds: Normal breath sounds.  Abdominal:     General: Abdomen is flat.     Palpations: Abdomen is soft.     Tenderness: There is abdominal tenderness (mild LLQ). There is no guarding.  Genitourinary:    Comments: Chaperone present. Normal external  genitalia. Thin white discharge, cervix is closed. Mild L adnexal tenderness, no mass, no guarding, no CMT.  Musculoskeletal:        General: No swelling. Normal range of motion.     Cervical back: Neck supple.  Skin:    General: Skin is warm and dry.  Neurological:     General: No focal deficit present.     Mental Status: She is alert.  Psychiatric:        Mood and Affect: Mood normal.     ED Results / Procedures / Treatments   Labs (all labs ordered are listed, but only abnormal results are displayed) Labs Reviewed  WET PREP, GENITAL - Abnormal; Notable for the following components:      Result Value   Clue Cells Wet Prep HPF POC PRESENT (*)    WBC, Wet Prep HPF POC PRESENT (*)    All other components within normal limits  LIPASE, BLOOD - Abnormal;  Notable for the following components:   Lipase 92 (*)    All other components within normal limits  COMPREHENSIVE METABOLIC PANEL - Abnormal; Notable for the following components:   Calcium 8.5 (*)    All other components within normal limits  CBC - Abnormal; Notable for the following components:   RBC 3.72 (*)    Hemoglobin 11.6 (*)    HCT 35.4 (*)    All other components within normal limits  URINALYSIS, ROUTINE W REFLEX MICROSCOPIC  I-STAT BETA HCG BLOOD, ED ([facility], [facility], [facility] ONLY)  GC/CHLAMYDIA PROBE AMP () NOT AT [facility]    EKG None  Radiology No results found.  Procedures Procedures  Medications Ordered in the ED Medications - No data to display   MDM Rules/Calculators/A&P MDM Labs done in triage are normal. Wet Prep, GC/CT collected and sent to lab.   ED Course  I have reviewed the triage vital signs and the nursing notes.  Pertinent labs & imaging results that were available during my care of the patient were reviewed by me and considered in my medical decision making (see chart for details).  Clinical Course as of 08/27/20 2235  Wed Aug 27, 2020  2231 Wet prep is positive for BV. Patient does not want empiric treatment for GC/CT pending those results.  [CS]    Clinical Course User Index [CS] Pollyann Savoy, MD    Final Clinical Impression(s) / ED Diagnoses Final diagnoses:  Bacterial vaginosis    Rx / DC Orders ED Discharge Orders          Ordered    metroNIDAZOLE (FLAGYL) 500 MG tablet  2 times daily        08/27/20 2233             Pollyann Savoy, MD 08/27/20 2235

## 2020-08-27 NOTE — ED Provider Notes (Signed)
Emergency Medicine Provider Triage Evaluation Note  [name] [name] 40 y.o. female was evaluated in triage.  Pt complains of lower abdominal cramping x3 days.  Patient reports 5 days ago, she had unprotected sex with a new sexual partner.  She reports that she is then started having some lower abdominal cramping.  Today, she noticed some vaginal discharge, prompting ED visit. No fevers, n/v/   Review of Systems  Positive: Abd pain, vaginal d/c  Negative: Fevers, n/v  Physical Exam  BP 134/82   Pulse 70   Temp 98.2 F (36.8 C) (Oral)   Resp 18   Ht 5\' 4"  (1.626 m)   Wt 65.8 kg   SpO2 100%   BMI 24.89 kg/m  Gen:   Awake, no distress   HEENT:  Atraumatic  Resp:  Normal effort  Cardiac:  Normal rate  Abd:   Nondistended, mild tenderness of the lower abdomen.  No rigidity, guarding. MSK:   Moves extremities without difficulty  Neuro:  Speech clear   Other:      Medical Decision Making  Medically screening exam initiated at 7:01 PM  Appropriate orders placed.  DRISHTI PEPPERMAN was informed that the remainder of the evaluation will be completed by another provider, this initial triage assessment does not replace that evaluation. They are counseled that they will need to remain in the ED until the completion of their workup, including full H&P and results of any tests.  Risks of leaving the emergency department prior to completion of treatment were discussed. Patient was advised to inform ED staff if they are leaving before their treatment is complete. The patient acknowledged these risks and time was allowed for questions.     The patient appears stable so that the remainder of the MSE may be completed by another provider.    Clinical Impression  Abd pain, vaginal d/c   Portions of this note were generated with Dragon dictation software. Dictation errors may occur despite best attempts at proofreading.     Lincoln Brigham, PA-C 08/27/20 1903    08/29/20,  MD 08/27/20 2226

## 2020-08-28 LAB — GC/CHLAMYDIA PROBE AMP (~~LOC~~) NOT AT ARMC
Chlamydia: NEGATIVE
Comment: NEGATIVE
Comment: NORMAL
Neisseria Gonorrhea: NEGATIVE

## 2020-10-19 ENCOUNTER — Encounter (HOSPITAL_COMMUNITY): Payer: Self-pay

## 2020-10-19 ENCOUNTER — Emergency Department (HOSPITAL_COMMUNITY)
Admission: EM | Admit: 2020-10-19 | Discharge: 2020-10-20 | Disposition: A | Discharge status: Home / Self Care | Payer: BC Managed Care – PPO | Attending: Emergency Medicine | Admitting: Emergency Medicine

## 2020-10-19 ENCOUNTER — Other Ambulatory Visit: Payer: Self-pay

## 2020-10-19 DIAGNOSIS — Z87891 Personal history of nicotine dependence: Secondary | ICD-10-CM | POA: Insufficient documentation

## 2020-10-19 DIAGNOSIS — N898 Other specified noninflammatory disorders of vagina: Secondary | ICD-10-CM | POA: Diagnosis present

## 2020-10-19 DIAGNOSIS — R102 Pelvic and perineal pain: Secondary | ICD-10-CM | POA: Diagnosis not present

## 2020-10-19 LAB — URINALYSIS, ROUTINE W REFLEX MICROSCOPIC
Bilirubin Urine: NEGATIVE
Glucose, UA: NEGATIVE mg/dL
Hgb urine dipstick: NEGATIVE
Ketones, ur: NEGATIVE mg/dL
Leukocytes,Ua: NEGATIVE
Nitrite: NEGATIVE
Protein, ur: NEGATIVE mg/dL
Specific Gravity, Urine: 1.021 (ref 1.005–1.030)
pH: 7 (ref 5.0–8.0)

## 2020-10-19 LAB — RAPID HIV SCREEN (HIV 1/2 AB+AG)
HIV 1/2 Antibodies: NONREACTIVE
HIV-1 P24 Antigen - HIV24: NONREACTIVE
Interpretation (HIV Ag Ab): NEGATIVE

## 2020-10-19 MED ORDER — CEFTRIAXONE SODIUM 1 G IJ SOLR
500.0000 mg | Freq: Once | INTRAMUSCULAR | Status: AC
  Administered 2020-10-20: 500 mg via INTRAMUSCULAR
  Filled 2020-10-19: qty 10

## 2020-10-19 MED ORDER — DOXYCYCLINE HYCLATE 100 MG PO TABS
100.0000 mg | ORAL_TABLET | Freq: Once | ORAL | Status: AC
  Administered 2020-10-20: 100 mg via ORAL
  Filled 2020-10-19: qty 1

## 2020-10-19 NOTE — ED Provider Notes (Signed)
[facility]-EMERGENCY DEPT Provider Note   CSN: [id number] Arrival date & time: 10/19/20  2116     History Chief Complaint  Patient presents with   Exposure to STD    [name] [name] is a 40 y.o. female.  Patient presents to the emergency department today for evaluation of vaginal discharge.  Patient states that 1 week ago she developed a strong odor to her urine.  In the past several days she has developed a thick vaginal discharge, different than usual discharge, and mild pelvic cramping.  She states that she is prone to BV and yeast infections, however this is different.  No nausea or vomiting.  No fevers.  She states that she had unprotected sex with a partner for the first time and is concerned about sexual transmitted infection.  Onset of symptoms acute.  Course is constant.      Past Medical History:  Diagnosis Date   BV (bacterial vaginosis)    Medical history non-contributory    Syphilis     Patient Active Problem List   Diagnosis Date Noted   Bacterial vaginosis 11/07/2013   STD exposure 11/07/2013   Single delivery by C-section 09/04/2013   Post-operative state 09/04/2013   Insufficient prenatal care in third trimester 07/20/2013   Drug use complicating pregnancy in second trimester 04/30/2013   Supervision of normal subsequent pregnancy 04/25/2013   Incomplete spontaneous abortion without mention of complication 09/13/2012    Past Surgical History:  Procedure Laterality Date   CESAREAN SECTION     CESAREAN SECTION N/A 09/04/2013   Procedure: CESAREAN SECTION;  Surgeon: Allie Bossier, MD;  Location: [facility];  Service: Obstetrics;  Laterality: N/A;   DILATION AND CURETTAGE OF UTERUS     DILATION AND EVACUATION N/A 09/12/2012   Procedure: DILATATION AND EVACUATION;  Surgeon: Adam Phenix, MD;  Location: [facility];  Service: Gynecology;  Laterality: N/A;     OB History     Gravida  6   Para  3   Term  2   Preterm  1   AB  3   Living   3      SAB  2   IAB  1   Ectopic  0   Multiple  0   Live Births  3           Family History  Problem Relation Age of Onset   Diabetes Mother    Hypertension Mother    Hypertension Father    Stroke Maternal Aunt    Hearing loss Neg Hx     Social History   Tobacco Use   Smoking status: Former    Types: Cigarettes   Smokeless tobacco: Never  Substance Use Topics   Alcohol use: Yes   Drug use: Yes    Types: Marijuana    Home Medications Prior to Admission medications   Medication Sig Start Date End Date Taking? Authorizing Provider  metroNIDAZOLE (FLAGYL) 500 MG tablet Take 1 tablet (500 mg total) by mouth 2 (two) times daily. 08/27/20   Pollyann Savoy, MD  Multiple Vitamins-Minerals (HAIR/SKIN/NAILS/BIOTIN PO) Take 1 tablet by mouth daily.    [provider]    Allergies    Patient has no known allergies.  Review of Systems   Review of Systems  Constitutional:  Negative for fever.  HENT:  Negative for rhinorrhea and sore throat.   Eyes:  Negative for redness.  Respiratory:  Negative for cough.   Cardiovascular:  Negative for chest pain.  Gastrointestinal:  Negative for abdominal pain, diarrhea, nausea and vomiting.  Genitourinary:  Positive for pelvic pain and vaginal discharge. Negative for dysuria, frequency, hematuria, urgency and vaginal bleeding.  Musculoskeletal:  Negative for myalgias.  Skin:  Negative for rash.  Neurological:  Negative for headaches.   Physical Exam Updated Vital Signs BP (!) 102/55 (BP Location: Left Arm)   Pulse 90   Temp 98.1 F (36.7 C) (Oral)   Resp 16   SpO2 97%   Physical Exam Vitals and nursing note reviewed. Exam conducted with a chaperone present.  Constitutional:      General: She is not in acute distress.    Appearance: She is well-developed.  HENT:     Head: Normocephalic and atraumatic.     Right Ear: External ear normal.     Left Ear: External ear normal.     Nose: Nose normal.  Eyes:      Conjunctiva/sclera: Conjunctivae normal.  Cardiovascular:     Rate and Rhythm: Normal rate and regular rhythm.     Heart sounds: No murmur heard. Pulmonary:     Effort: No respiratory distress.     Breath sounds: No wheezing, rhonchi or rales.  Abdominal:     Palpations: Abdomen is soft.     Tenderness: There is no abdominal tenderness. There is no guarding or rebound.  Genitourinary:    Exam position: Supine.     Labia:        Right: No lesion.        Left: No lesion.      Vagina: Vaginal discharge (scant) and bleeding (scant) present.     Cervix: No cervical motion tenderness or discharge.     Uterus: Not tender.      Adnexa:        Right: No tenderness.         Left: No tenderness.    Musculoskeletal:     Cervical back: Normal range of motion and neck supple.     Right lower leg: No edema.     Left lower leg: No edema.  Skin:    General: Skin is warm and dry.     Findings: No rash.  Neurological:     General: No focal deficit present.     Mental Status: She is alert. Mental status is at baseline.     Motor: No weakness.  Psychiatric:        Mood and Affect: Mood normal.    ED Results / Procedures / Treatments   Labs (all labs ordered are listed, but only abnormal results are displayed) Labs Reviewed  WET PREP, GENITAL - Abnormal; Notable for the following components:      Result Value   Clue Cells Wet Prep HPF POC PRESENT (*)    WBC, Wet Prep HPF POC MODERATE (*)    All other components within normal limits  URINALYSIS, ROUTINE W REFLEX MICROSCOPIC - Abnormal; Notable for the following components:   APPearance HAZY (*)    All other components within normal limits  RAPID HIV SCREEN (HIV 1/2 AB+AG)  HIV ANTIBODY (ROUTINE TESTING W REFLEX)  RPR  GC/CHLAMYDIA PROBE AMP ([place]) NOT AT [facility]    EKG None  Radiology No results found.  Procedures Procedures   Medications Ordered in ED Medications - No data to display  ED Course  I have reviewed  the triage vital signs and the nursing notes.  Pertinent labs & imaging results that were available  during my care of the patient were reviewed by me and considered in my medical decision making (see chart for details).  Patient seen and examined. Work-up initiated.   Vital signs reviewed and are as follows: BP (!) 102/55 (BP Location: Left Arm)   Pulse 90   Temp 98.1 F (36.7 C) (Oral)   Resp 16   SpO2 97%   Pelvic exam performed with RN chaperone.  Waiting results of wet prep.  Patient elects to have prophylactic treatment.  12:10 AM wet prep with clue cells.  Prescription for Flagyl, doxycycline.  Encourage PCP/GYN follow-up as needed.  Plan for discharged home.  Patient knows to check on gonorrhea chlamydia testing in the next several days.    MDM Rules/Calculators/A&P                           Patient with vaginal discharge, concern for sexual transmitted infection exposure.  Testing and treatment as above.   Final Clinical Impression(s) / ED Diagnoses Final diagnoses:  Vaginal discharge    Rx / DC Orders ED Discharge Orders          Ordered    metroNIDAZOLE (FLAGYL) 500 MG tablet  2 times daily        10/20/20 0002    doxycycline (VIBRAMYCIN) 100 MG capsule  2 times daily        10/20/20 0010             Renne Crigler, PA-C 10/20/20 0011    Cheryll Cockayne, MD 10/20/20 1558

## 2020-10-19 NOTE — ED Triage Notes (Signed)
Pt reports having discharge and odor x 2 weeks. Pt states that she has had unprotected sex and wants an STD check.

## 2020-10-20 LAB — WET PREP, GENITAL
Sperm: NONE SEEN
Trich, Wet Prep: NONE SEEN
Yeast Wet Prep HPF POC: NONE SEEN

## 2020-10-20 LAB — HIV ANTIBODY (ROUTINE TESTING W REFLEX): HIV Screen 4th Generation wRfx: NONREACTIVE

## 2020-10-20 MED ORDER — METRONIDAZOLE 500 MG PO TABS: 500.0000 mg | ORAL_TABLET | Freq: Two times a day (BID) | ORAL | 0 refills | Status: DC

## 2020-10-20 MED ORDER — LIDOCAINE HCL (PF) 1 % IJ SOLN
INTRAMUSCULAR | Status: AC
  Filled 2020-10-20: qty 30

## 2020-10-20 MED ORDER — DOXYCYCLINE HYCLATE 100 MG PO CAPS: 100.0000 mg | ORAL_CAPSULE | Freq: Two times a day (BID) | ORAL | 0 refills | Status: DC

## 2020-10-20 NOTE — Discharge Instructions (Signed)
Please read and follow all provided instructions.  Your diagnoses today include:  1. Vaginal discharge     Tests performed today include: GC/chlamydia testing -pending Wet prep -negative for yeast, possible BV UA without infection Vital signs. See below for your results today.   Medications prescribed:  Metronidazole - antibiotic  You have been prescribed an antibiotic medicine: take the entire course of medicine even if you are feeling better. Stopping early can cause the antibiotic not to work. Do not drink alcohol when taking this medication.   Take any prescribed medications only as directed.  Home care instructions:  Follow any educational materials contained in this packet.  BE VERY CAREFUL not to take multiple medicines containing Tylenol (also called acetaminophen). Doing so can lead to an overdose which can damage your liver and cause liver failure and possibly death.   Follow-up instructions: Please follow-up with your primary care provider in the next 3 days for further evaluation of your symptoms.   Return instructions:  Please return to the Emergency Department if you experience worsening symptoms.  Please return if you have any other emergent concerns.  Additional Information:  Your vital signs today were: BP (!) 102/55 (BP Location: Left Arm)   Pulse 90   Temp 98.1 F (36.7 C) (Oral)   Resp 16   SpO2 97%  If your blood pressure (BP) was elevated above 135/85 this visit, please have this repeated by your doctor within one month. --------------

## 2020-10-21 LAB — GC/CHLAMYDIA PROBE AMP (~~LOC~~) NOT AT ARMC
Chlamydia: NEGATIVE
Comment: NEGATIVE
Comment: NORMAL
Neisseria Gonorrhea: NEGATIVE

## 2020-11-13 ENCOUNTER — Encounter (HOSPITAL_COMMUNITY): Payer: Self-pay | Admitting: Emergency Medicine

## 2020-11-13 ENCOUNTER — Ambulatory Visit (HOSPITAL_COMMUNITY)
Admission: EM | Admit: 2020-11-13 | Discharge: 2020-11-13 | Disposition: A | Discharge status: Home / Self Care | Payer: BC Managed Care – PPO | Attending: Emergency Medicine | Admitting: Emergency Medicine

## 2020-11-13 ENCOUNTER — Other Ambulatory Visit: Payer: Self-pay

## 2020-11-13 DIAGNOSIS — B9689 Other specified bacterial agents as the cause of diseases classified elsewhere: Secondary | ICD-10-CM | POA: Diagnosis not present

## 2020-11-13 DIAGNOSIS — N760 Acute vaginitis: Secondary | ICD-10-CM

## 2020-11-13 MED ORDER — METRONIDAZOLE 500 MG PO TABS: 500.0000 mg | ORAL_TABLET | Freq: Two times a day (BID) | ORAL | 0 refills | Status: DC

## 2020-11-13 NOTE — Discharge Instructions (Addendum)
Take the Flagyl 1 pill twice a day for the next 7 days.  Do not drink alcohol while you are taking this medication  Try to avoid tight fitting clothing and wear breathable fabrics such as cotton. Do not douche or use any soaps that are scented or have dyes.  You can try boric acid suppository 1 suppository every few days to help prevent BV from recurring.  Return or go to the Emergency Department if symptoms worsen or do not improve in the next few days.

## 2020-11-13 NOTE — ED Provider Notes (Signed)
[facility]    CSN: [id number] Arrival date & time: 11/13/20  1955      History   Chief Complaint Chief Complaint  Patient presents with   Vaginal Discharge    HPI [name] [name] is a 40 y.o. female.   Patient here for evaluation of possible BV.  Patient reports lower abdominal cramping, discharge, and a fishy odor that has been ongoing for the past several days.  Patient reports history of BV and with similar symptoms that did resolve with Flagyl.  Patient reports that she has stopped douching and using scented soaps.  Has not tried any OTC medications or treatments.  Denies any trauma, injury, or other precipitating event.  Denies any specific alleviating or aggravating factors.  Denies any fevers, chest pain, shortness of breath, N/V/D, numbness, tingling, weakness, abdominal pain, or headaches.    The history is provided by the patient.  Vaginal Discharge  Past Medical History:  Diagnosis Date   BV (bacterial vaginosis)    Medical history non-contributory    Syphilis     Patient Active Problem List   Diagnosis Date Noted   Bacterial vaginosis 11/07/2013   STD exposure 11/07/2013   Single delivery by C-section 09/04/2013   Post-operative state 09/04/2013   Insufficient prenatal care in third trimester 07/20/2013   Drug use complicating pregnancy in second trimester 04/30/2013   Supervision of normal subsequent pregnancy 04/25/2013   Incomplete spontaneous abortion without mention of complication 09/13/2012    Past Surgical History:  Procedure Laterality Date   CESAREAN SECTION     CESAREAN SECTION N/A 09/04/2013   Procedure: CESAREAN SECTION;  Surgeon: Allie Bossier, MD;  Location: [facility];  Service: Obstetrics;  Laterality: N/A;   DILATION AND CURETTAGE OF UTERUS     DILATION AND EVACUATION N/A 09/12/2012   Procedure: DILATATION AND EVACUATION;  Surgeon: Adam Phenix, MD;  Location: [facility];  Service: Gynecology;  Laterality: N/A;    OB History      Gravida  6   Para  3   Term  2   Preterm  1   AB  3   Living  3      SAB  2   IAB  1   Ectopic  0   Multiple  0   Live Births  3            Home Medications    Prior to Admission medications   Medication Sig Start Date End Date Taking? Authorizing Provider  metroNIDAZOLE (FLAGYL) 500 MG tablet Take 1 tablet (500 mg total) by mouth 2 (two) times daily. 11/13/20  Yes Ivette Loyal, NP  doxycycline (VIBRAMYCIN) 100 MG capsule Take 1 capsule (100 mg total) by mouth 2 (two) times daily. 10/20/20   Renne Crigler, PA-C  Multiple Vitamins-Minerals (HAIR/SKIN/NAILS/BIOTIN PO) Take 1 tablet by mouth daily.    [provider]    Family History Family History  Problem Relation Age of Onset   Diabetes Mother    Hypertension Mother    Hypertension Father    Stroke Maternal Aunt    Hearing loss Neg Hx     Social History Social History   Tobacco Use   Smoking status: Former    Types: Cigarettes   Smokeless tobacco: Never  Substance Use Topics   Alcohol use: Yes   Drug use: Yes    Types: Marijuana     Allergies   Patient has no known allergies.   Review of  Systems Review of Systems  Genitourinary:  Positive for pelvic pain and vaginal discharge. Negative for vaginal pain.  All other systems reviewed and are negative.   Physical Exam Triage Vital Signs ED Triage Vitals  Enc Vitals Group     BP 11/13/20 2026 126/84     Pulse Rate 11/13/20 2026 81     Resp 11/13/20 2026 16     Temp 11/13/20 2026 98.3 F (36.8 C)     Temp Source 11/13/20 2026 Oral     SpO2 11/13/20 2026 98 %     Weight --      Height --      Head Circumference --      Peak Flow --      Pain Score 11/13/20 2024 4     Pain Loc --      Pain Edu? --      Excl. in GC? --    No data found.  Updated Vital Signs BP 126/84   Pulse 81   Temp 98.3 F (36.8 C) (Oral)   Resp 16   LMP 10/16/2020   SpO2 98%   Visual Acuity Right Eye Distance:   Left Eye Distance:    Bilateral Distance:    Right Eye Near:   Left Eye Near:    Bilateral Near:     Physical Exam Vitals and nursing note reviewed.  Constitutional:      General: She is not in acute distress.    Appearance: Normal appearance. She is not ill-appearing, toxic-appearing or diaphoretic.  HENT:     Head: Normocephalic and atraumatic.  Eyes:     Conjunctiva/sclera: Conjunctivae normal.  Cardiovascular:     Rate and Rhythm: Normal rate.     Pulses: Normal pulses.  Pulmonary:     Effort: Pulmonary effort is normal.  Abdominal:     General: Abdomen is flat.  Genitourinary:    Comments: declines Musculoskeletal:        General: Normal range of motion.     Cervical back: Normal range of motion.  Skin:    General: Skin is warm and dry.  Neurological:     General: No focal deficit present.     Mental Status: She is alert and oriented to person, place, and time.  Psychiatric:        Mood and Affect: Mood normal.     UC Treatments / Results  Labs (all labs ordered are listed, but only abnormal results are displayed) Labs Reviewed - No data to display  EKG   Radiology No results found.  Procedures Procedures (including critical care time)  Medications Ordered in UC Medications - No data to display  Initial Impression / Assessment and Plan / UC Course  I have reviewed the triage vital signs and the nursing notes.  Pertinent labs & imaging results that were available during my care of the patient were reviewed by me and considered in my medical decision making (see chart for details).    Assessment negative for red flags or concerns.  Bacterial vaginosis.  We will treat with Flagyl twice daily for the next 7 days.  Recommend avoiding tight fitting clothing and wearing breathable fabrics.  Instructed patient that she should not douche or use any soaps that are scented.  May try boric acid suppositories every few days to try and help prevent BV from occurring.  Recommend  following up with primary care for reevaluation. Final Clinical Impressions(s) / UC Diagnoses   Final diagnoses:  BV (  bacterial vaginosis)     Discharge Instructions      Take the Flagyl 1 pill twice a day for the next 7 days.  Do not drink alcohol while you are taking this medication  Try to avoid tight fitting clothing and wear breathable fabrics such as cotton. Do not douche or use any soaps that are scented or have dyes.  You can try boric acid suppository 1 suppository every few days to help prevent BV from recurring.  Return or go to the Emergency Department if symptoms worsen or do not improve in the next few days.       ED Prescriptions     Medication Sig Dispense Auth. Provider   metroNIDAZOLE (FLAGYL) 500 MG tablet Take 1 tablet (500 mg total) by mouth 2 (two) times daily. 14 tablet Ivette Loyal, NP      PDMP not reviewed this encounter.   Ivette Loyal, NP 11/13/20 2052

## 2020-11-13 NOTE — ED Triage Notes (Signed)
Reports frequent BV. Has discharge, odor, cramping.

## 2020-12-26 ENCOUNTER — Encounter (HOSPITAL_COMMUNITY): Payer: Self-pay

## 2020-12-26 ENCOUNTER — Ambulatory Visit (HOSPITAL_COMMUNITY)
Admission: EM | Admit: 2020-12-26 | Discharge: 2020-12-26 | Disposition: A | Discharge status: Home / Self Care | Payer: BC Managed Care – PPO | Attending: Emergency Medicine | Admitting: Emergency Medicine

## 2020-12-26 ENCOUNTER — Other Ambulatory Visit: Payer: Self-pay

## 2020-12-26 DIAGNOSIS — R42 Dizziness and giddiness: Secondary | ICD-10-CM | POA: Insufficient documentation

## 2020-12-26 DIAGNOSIS — N898 Other specified noninflammatory disorders of vagina: Secondary | ICD-10-CM | POA: Insufficient documentation

## 2020-12-26 LAB — COMPREHENSIVE METABOLIC PANEL
ALT: 29 U/L (ref 0–44)
AST: 23 U/L (ref 15–41)
Albumin: 3.7 g/dL (ref 3.5–5.0)
Alkaline Phosphatase: 90 U/L (ref 38–126)
Anion gap: 9 (ref 5–15)
BUN: 9 mg/dL (ref 6–20)
CO2: 25 mmol/L (ref 22–32)
Calcium: 9.2 mg/dL (ref 8.9–10.3)
Chloride: 102 mmol/L (ref 98–111)
Creatinine, Ser: 0.7 mg/dL (ref 0.44–1.00)
GFR, Estimated: >60 mL/min (ref >60)
Glucose, Bld: 88 mg/dL (ref 70–99)
Potassium: 3.8 mmol/L (ref 3.5–5.1)
Sodium: 136 mmol/L (ref 135–145)
Total Bilirubin: 0.9 mg/dL (ref 0.3–1.2)
Total Protein: 7.5 g/dL (ref 6.5–8.1)

## 2020-12-26 MED ORDER — MECLIZINE HCL 12.5 MG PO TABS: 12.5000 mg | ORAL_TABLET | Freq: Three times a day (TID) | ORAL | 0 refills | Status: AC | PRN

## 2020-12-26 MED ORDER — LOPERAMIDE HCL 2 MG PO CAPS: 2.0000 mg | ORAL_CAPSULE | Freq: Four times a day (QID) | ORAL | 0 refills | Status: DC | PRN

## 2020-12-26 MED ORDER — METRONIDAZOLE 500 MG PO TABS: 500.0000 mg | ORAL_TABLET | Freq: Two times a day (BID) | ORAL | 0 refills | Status: DC

## 2020-12-26 NOTE — Discharge Instructions (Addendum)
For your dizziness While completing lab work to check your electrolytes to look for dehydration, if any lab values are concerning you will be notified You have been prescribed meclizine that you may take up to 3 times a day if dizziness reoccurs Please continue to increase your water and fluid intake while diarrhea is present May use Imodium up to 4 times a day to help reduce diarrhea, be mindful over use of this medication will have the opposite effect of constipation If dizziness continues please follow-up with your primary care doctor for further evaluation and treatment  For your vaginal discharge Your vaginal swab is pending 2 to 3 days, you will be notified of any positive results I have sent metronidazole for you for treatment of bacterial vaginosis, take 2 pills a day for the next 7 days, do not drink alcohol while taking medication as this will make you very sick May continue use of boric acid suppositories You do not need to use any fancy soaps to clean sure vaginal area or body a unscented antibacterial soap will do the job

## 2020-12-26 NOTE — ED Triage Notes (Signed)
Pt presents with c/o dizziness since this morning states she feels her head is cloudy. States she tried to elevate her head to feel relief. States she does not have a fever, cough, and states she had a diarrhea.

## 2020-12-26 NOTE — ED Provider Notes (Signed)
[facility]    CSN: [id number] Arrival date & time: 12/26/20  1146      History   Chief Complaint Chief Complaint  Patient presents with  . Dizziness    HPI [name] [name] is a 40 y.o. female.   Patient presents with dizziness beginning this morning upon awakening.  Sensation is that the room is spinning and uneasiness with movement.  Has decreased throughout the day and current intensity is described as a 2 out of 10.  This is never occurred before.  Patient endorses that she has had diarrhea all week after returning from a trip to [place] and has been attempting to eat increased water intake to maintain hydration.  No pertinent medical history.  Does have establish PCP.  Patient having [name] [name] thick vaginal discharge and sharp lower abdominal pain for 3 to 4 days.  Denies odor, vaginal itching or irritation, dysuria, urinary frequency or urgency, flank pain, hematuria.  Endorses that she gets frequent BV infections and takes prophylactic boric acid.  Sexually active, 1 partner, no condom use.  Past Medical History:  Diagnosis Date  . BV (bacterial vaginosis)   . Medical history non-contributory   . Syphilis     Patient Active Problem List   Diagnosis Date Noted  . Bacterial vaginosis 11/07/2013  . STD exposure 11/07/2013  . Single delivery by C-section 09/04/2013  . Post-operative state 09/04/2013  . Insufficient prenatal care in third trimester 07/20/2013  . 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 Supervision of normal subsequent pregnancy 04/25/2013  . Incomplete spontaneous abortion without mention of complication 09/13/2012    Past Surgical History:  Procedure Laterality Date  . CESAREAN SECTION    . CESAREAN SECTION N/A 09/04/2013   Procedure: CESAREAN SECTION;  Surgeon: Allie Bossier, MD;  Location: [facility];  Service: Obstetrics;  Laterality: N/A;  . DILATION AND CURETTAGE OF UTERUS    . DILATION AND EVACUATION N/A 09/12/2012    Procedure: DILATATION AND EVACUATION;  Surgeon: Adam Phenix, MD;  Location: [facility];  Service: Gynecology;  Laterality: N/A;    OB History     Gravida  6   Para  3   Term  2   Preterm  1   AB  3   Living  3      SAB  2   IAB  1   Ectopic  0   Multiple  0   Live Births  3            Home Medications    Prior to Admission medications   Medication Sig Start Date End Date Taking? Authorizing Provider  loperamide (IMODIUM) 2 MG capsule Take 1 capsule (2 mg total) by mouth 4 (four) times daily as needed for diarrhea or loose stools. 12/26/20  Yes Tauno Falotico, Elita Boone, NP  meclizine (ANTIVERT) 12.5 MG tablet Take 1 tablet (12.5 mg total) by mouth 3 (three) times daily as needed for dizziness. 12/26/20  Yes Geneveive Furness R, NP  metroNIDAZOLE (FLAGYL) 500 MG tablet Take 1 tablet (500 mg total) by mouth 2 (two) times daily. 12/26/20  Yes Severus Brodzinski, Elita Boone, NP  doxycycline (VIBRAMYCIN) 100 MG capsule Take 1 capsule (100 mg total) by mouth 2 (two) times daily. 10/20/20   Renne Crigler, PA-C  Multiple Vitamins-Minerals (HAIR/SKIN/NAILS/BIOTIN PO) Take 1 tablet by mouth daily.    [provider]    Family History Family History  Problem Relation Age of Onset  . Diabetes  Mother   . Hypertension Mother   . Hypertension Father   . Stroke Maternal Aunt   . Hearing loss Neg Hx     Social History Social History   Tobacco Use  . Smoking status: Former    Types: Cigarettes  . Smokeless tobacco: Never  Substance Use Topics  . Alcohol use: Yes  . Drug use: Yes    Types: Marijuana     Allergies   Patient has no known allergies.   Review of Systems Review of Systems Defer to HPI    Physical Exam Triage Vital Signs ED Triage Vitals  Enc Vitals Group     BP 12/26/20 1254 (!) 142/90     Pulse Rate 12/26/20 1254 69     Resp 12/26/20 1254 18     Temp 12/26/20 1254 99.1 F (37.3 C)     Temp Source 12/26/20 1254 Oral     SpO2 12/26/20 1254 98 %      Weight --      Height --      Head Circumference --      Peak Flow --      Pain Score 12/26/20 1302 0     Pain Loc --      Pain Edu? --      Excl. in GC? --    No data found.  Updated Vital Signs BP (!) 142/90   Pulse 69   Temp 99.1 F (37.3 C) (Oral)   Resp 18   LMP  (LMP Unknown)   SpO2 98%   Visual Acuity Right Eye Distance:   Left Eye Distance:   Bilateral Distance:    Right Eye Near:   Left Eye Near:    Bilateral Near:     Physical Exam Constitutional:      Appearance: Normal appearance. She is normal weight.  HENT:     Head: Normocephalic.     Right Ear: Tympanic membrane, ear canal and external ear normal.     Left Ear: Tympanic membrane, ear canal and external ear normal.     Nose:     Right Sinus: No maxillary sinus tenderness or frontal sinus tenderness.     Left Sinus: No maxillary sinus tenderness or frontal sinus tenderness.  Eyes:     Extraocular Movements: Extraocular movements intact.  Pulmonary:     Effort: Pulmonary effort is normal.  Skin:    General: Skin is warm and dry.  Neurological:     General: No focal deficit present.     Mental Status: She is alert and oriented to person, place, and time. Mental status is at baseline.     Cranial Nerves: No cranial nerve deficit.     Sensory: No sensory deficit.     Motor: No weakness.     Gait: Gait normal.  Psychiatric:        Mood and Affect: Mood normal.        Behavior: Behavior normal.     UC Treatments / Results  Labs (all labs ordered are listed, but only abnormal results are displayed) Labs Reviewed  COMPREHENSIVE METABOLIC PANEL  CERVICOVAGINAL ANCILLARY ONLY    EKG   Radiology No results found.  Procedures Procedures (including critical care time)  Medications Ordered in UC Medications - No data to display  Initial Impression / Assessment and Plan / UC Course  I have reviewed the triage vital signs and the nursing notes.  Pertinent labs & imaging results that were  available during my  care of the patient were reviewed by me and considered in my medical decision making (see chart for details).  Is Vaginal discharge  Unknown etiology of symptom, does not appear to be sinus or neurological cause, exam unremarkable, discussed with patient, will obtain CMP, labs pending, encourage patient to continue to increase electrolytes especially while diarrhea still present, prescribed Imodium to be used as needed, prescribed meclizine prn. Recommended follow-up with PCP for persistent symptoms, vaginal swab obtained, lab is pending, will treat prophylactically for BV, metronidazole prescribed, advised cessation of alcohol during medication use, patient using some special vaginal body wash, encourage discontinuation of use Final Clinical Impressions(s) / UC Diagnoses   Final diagnoses:  Dizziness  Vaginal discharge     Discharge Instructions      For your dizziness While completing lab work to check your electrolytes to look for dehydration, if any lab values are concerning you will be notified You have been prescribed meclizine that you may take up to 3 times a day if dizziness reoccurs Please continue to increase your water and fluid intake while diarrhea is present May use Imodium up to 4 times a day to help reduce diarrhea, be mindful over use of this medication will have the opposite effect of constipation If dizziness continues please follow-up with your primary care doctor for further evaluation and treatment  For your vaginal discharge Your vaginal swab is pending 2 to 3 days, you will be notified of any positive results I have sent metronidazole for you for treatment of bacterial vaginosis, take 2 pills a day for the next 7 days, do not drink alcohol while taking medication as this will make you very sick May continue use of boric acid suppositories You do not need to use any fancy soaps to clean sure vaginal area or body a unscented antibacterial soap  will do the job   ED Prescriptions     Medication Sig Dispense Auth. Provider   loperamide (IMODIUM) 2 MG capsule Take 1 capsule (2 mg total) by mouth 4 (four) times daily as needed for diarrhea or loose stools. 12 capsule Marene Gilliam R, NP   meclizine (ANTIVERT) 12.5 MG tablet Take 1 tablet (12.5 mg total) by mouth 3 (three) times daily as needed for dizziness. 30 tablet Jeliyah Middlebrooks R, NP   metroNIDAZOLE (FLAGYL) 500 MG tablet Take 1 tablet (500 mg total) by mouth 2 (two) times daily. 14 tablet Kainan Patty, Elita Boone, NP      PDMP not reviewed this encounter.   Valinda Hoar, NP 12/26/20 1418

## 2020-12-29 LAB — CERVICOVAGINAL ANCILLARY ONLY
Bacterial Vaginitis (gardnerella): POSITIVE — AB
Candida Glabrata: NEGATIVE
Candida Vaginitis: NEGATIVE
Chlamydia: NEGATIVE
Comment: NEGATIVE
Comment: NEGATIVE
Comment: NEGATIVE
Comment: NEGATIVE
Comment: NEGATIVE
Comment: NORMAL
Neisseria Gonorrhea: NEGATIVE
Trichomonas: NEGATIVE

## 2021-05-03 ENCOUNTER — Emergency Department (HOSPITAL_COMMUNITY)
Admission: EM | Admit: 2021-05-03 | Discharge: 2021-05-03 | Discharge status: Against Medical Advice | Payer: Self-pay | Attending: Emergency Medicine | Admitting: Emergency Medicine

## 2021-05-03 ENCOUNTER — Encounter (HOSPITAL_COMMUNITY): Payer: Self-pay | Admitting: Emergency Medicine

## 2021-05-03 ENCOUNTER — Other Ambulatory Visit: Payer: Self-pay

## 2021-05-03 DIAGNOSIS — R748 Abnormal levels of other serum enzymes: Secondary | ICD-10-CM | POA: Insufficient documentation

## 2021-05-03 DIAGNOSIS — N898 Other specified noninflammatory disorders of vagina: Secondary | ICD-10-CM

## 2021-05-03 DIAGNOSIS — R1032 Left lower quadrant pain: Secondary | ICD-10-CM

## 2021-05-03 LAB — COMPREHENSIVE METABOLIC PANEL
ALT: 19 U/L (ref 0–44)
AST: 19 U/L (ref 15–41)
Albumin: 3.6 g/dL (ref 3.5–5.0)
Alkaline Phosphatase: 85 U/L (ref 38–126)
Anion gap: 6 (ref 5–15)
BUN: 13 mg/dL (ref 6–20)
CO2: 26 mmol/L (ref 22–32)
Calcium: 8.6 mg/dL — ABNORMAL LOW (ref 8.9–10.3)
Chloride: 103 mmol/L (ref 98–111)
Creatinine, Ser: 0.78 mg/dL (ref 0.44–1.00)
GFR, Estimated: >60 mL/min (ref >60)
Glucose, Bld: 116 mg/dL — ABNORMAL HIGH (ref 70–99)
Potassium: 4 mmol/L (ref 3.5–5.1)
Sodium: 135 mmol/L (ref 135–145)
Total Bilirubin: 0.7 mg/dL (ref 0.3–1.2)
Total Protein: 7.6 g/dL (ref 6.5–8.1)

## 2021-05-03 LAB — CBC
HCT: 36.7 % (ref 36.0–46.0)
Hemoglobin: 11.7 g/dL — ABNORMAL LOW (ref 12.0–15.0)
MCH: 30.4 pg (ref 26.0–34.0)
MCHC: 31.9 g/dL (ref 30.0–36.0)
MCV: 95.3 fL (ref 80.0–100.0)
Platelets: 349 10*3/uL (ref 150–400)
RBC: 3.85 MIL/uL — ABNORMAL LOW (ref 3.87–5.11)
RDW: 12.9 % (ref 11.5–15.5)
WBC: 6.4 10*3/uL (ref 4.0–10.5)
nRBC: 0 % (ref 0.0–0.2)

## 2021-05-03 LAB — WET PREP, GENITAL
Clue Cells Wet Prep HPF POC: NONE SEEN
Sperm: NONE SEEN
Trich, Wet Prep: NONE SEEN
WBC, Wet Prep HPF POC: <10 (ref <10)
Yeast Wet Prep HPF POC: NONE SEEN

## 2021-05-03 LAB — I-STAT BETA HCG BLOOD, ED (MC, WL, AP ONLY): I-stat hCG, quantitative: <5 m[IU]/mL (ref <5)

## 2021-05-03 LAB — URINALYSIS, ROUTINE W REFLEX MICROSCOPIC
Bilirubin Urine: NEGATIVE
Glucose, UA: NEGATIVE mg/dL
Hgb urine dipstick: NEGATIVE
Ketones, ur: NEGATIVE mg/dL
Leukocytes,Ua: NEGATIVE
Nitrite: NEGATIVE
Protein, ur: NEGATIVE mg/dL
Specific Gravity, Urine: 1.021 (ref 1.005–1.030)
pH: 6 (ref 5.0–8.0)

## 2021-05-03 LAB — LIPASE, BLOOD: Lipase: 115 U/L — ABNORMAL HIGH (ref 11–51)

## 2021-05-03 NOTE — ED Notes (Signed)
In room to check patient vitals. Patient not in room with gown on bed and monitoring devices on bed. No belongings left in the room of patient's. No IV catheter noted to be in the room. MD notified of patient elopement.

## 2021-05-03 NOTE — ED Provider Notes (Signed)
[facility] DEPT Provider Note   CSN: [id number] Arrival date & time: 05/03/21  [id number]     History  Chief Complaint  Patient presents with   Abdominal Pain    [name] [name] is a 41 y.o. female.  The history is provided by the patient and medical records. No language interpreter was used.  Abdominal Pain  41 year old female with prior history of STD exposure who presents with complaint of concerns for BV.  Patient states she has been having intermittent discomfort to her left pelvic region ongoing for over a week.  Initially she thought it may be related to her menstruation but after the resolution of her menstrual period she still having this residual discomfort.  It is waxing waning, mild in severity but felt somewhat similar to prior bacterial vaginosis that she has had in the past.  She did not notice any significant odor but did notice some thick white vaginal discharge.  She denies having fever chills no nausea vomiting or diarrhea no dysuria hematuria or rash.  She did tried taking some over-the-counter yeast cream without relief.  She would also like to be evaluated for potential STD given the fact that she has had 2 new sexual partner, using protection on occasion.  Her last menstrual period was 04/15/2021.  Home Medications Prior to Admission medications   Medication Sig Start Date End Date Taking? Authorizing Provider  doxycycline (VIBRAMYCIN) 100 MG capsule Take 1 capsule (100 mg total) by mouth 2 (two) times daily. 10/20/20   Carlisle Cater, PA-C  loperamide (IMODIUM) 2 MG capsule Take 1 capsule (2 mg total) by mouth 4 (four) times daily as needed for diarrhea or loose stools. 12/26/20   Hans Eden, NP  meclizine (ANTIVERT) 12.5 MG tablet Take 1 tablet (12.5 mg total) by mouth 3 (three) times daily as needed for dizziness. 12/26/20   White, Leitha Schuller, NP  metroNIDAZOLE (FLAGYL) 500 MG tablet Take 1 tablet (500 mg total) by mouth 2 (two)  times daily. 12/26/20   White, Leitha Schuller, NP  Multiple Vitamins-Minerals (HAIR/SKIN/NAILS/BIOTIN PO) Take 1 tablet by mouth daily.    [provider]      Allergies    Patient has no known allergies.    Review of Systems   Review of Systems  Gastrointestinal:  Positive for abdominal pain.  All other systems reviewed and are negative.  Physical Exam Updated Vital Signs BP (!) 174/94 (BP Location: Right Arm)    Pulse 85    Temp 99 F (37.2 C) (Oral)    Resp 16    Ht 5\' 4"  (1.626 m)    Wt 113.4 kg    LMP 04/15/2021 (Within Days)    SpO2 100%    BMI 42.91 kg/m  Physical Exam Vitals and nursing note reviewed.  Constitutional:      General: She is not in acute distress.    Appearance: She is well-developed.  HENT:     Head: Atraumatic.  Eyes:     Conjunctiva/sclera: Conjunctivae normal.  Cardiovascular:     Rate and Rhythm: Normal rate and regular rhythm.  Pulmonary:     Effort: Pulmonary effort is normal.  Abdominal:     General: Bowel sounds are normal.     Palpations: Abdomen is soft.     Tenderness: There is abdominal tenderness in the left lower quadrant.  Genitourinary:    Comments: Please refer to procedural note Musculoskeletal:     Cervical back: Neck supple.  Skin:  Findings: No rash.  Neurological:     Mental Status: She is alert.  Psychiatric:        Mood and Affect: Mood normal.    ED Results / Procedures / Treatments   Labs (all labs ordered are listed, but only abnormal results are displayed) Labs Reviewed  LIPASE, BLOOD - Abnormal; Notable for the following components:      Result Value   Lipase 115 (*)    All other components within normal limits  COMPREHENSIVE METABOLIC PANEL - Abnormal; Notable for the following components:   Glucose, Bld 116 (*)    Calcium 8.6 (*)    All other components within normal limits  CBC - Abnormal; Notable for the following components:   RBC 3.85 (*)    Hemoglobin 11.7 (*)    All other components within  normal limits  WET PREP, GENITAL  URINALYSIS, ROUTINE W REFLEX MICROSCOPIC  I-STAT BETA HCG BLOOD, ED ([facility], [facility] ONLY)  GC/CHLAMYDIA PROBE AMP ([place]) NOT AT [facility]    EKG None  Radiology No results found.  Procedures Pelvic exam  Date/Time: 05/03/2021 7:53 AM Performed by: Domenic Moras, PA-C Authorized by: Domenic Moras, PA-C  Comments: Chaperone present during exam.  No inguinal lymphadenopathy or inguinal hernia noted.  Normal external genitalia.  No significant discomfort with speculum insertion.  Normal vaginal vault free of lesion rash.  Close cervical os, mild functional discharge noted.  On bimanual examination no adnexal tenderness or cervical motion tenderness.      Medications Ordered in ED Medications - No data to display  ED Course/ Medical Decision Making/ A&P                           Medical Decision Making Amount and/or Complexity of Data Reviewed Labs: ordered. Radiology: ordered.   BP (!) 174/94 (BP Location: Right Arm)    Pulse 85    Temp 99 F (37.2 C) (Oral)    Resp 16    Ht 5\' 4"  (1.626 m)    Wt 113.4 kg    LMP 04/15/2021 (Within Days)    SpO2 100%    BMI 42.91 kg/m   7:26 AM Patient here with complaints of residual left pelvic discomfort and also voiced concern for potential bacterial vaginosis.  She would also like to be screened for STI due to having new sexual partner.  Patient overall well-appearing, abdomen soft nontender, she does not have any significant symptoms to suggest acute abdominal pathology.  Will perform pelvic exam and will screen for STI.  7:54 AM Pelvic exam performed by me without any concerning finding.  No signs to suggest PID.  No foreign body noted.  No signs of trauma.  No strong odor.  No significant vaginal discharge  8:49 AM Lab was independently reviewed and interpreted by me.Labs was independently interpreted urinalysis show no evidence of UTI, wet prep is unremarkable no evidence of BV, pregnancy test is negative,  lipase however is elevated at 115, the remainder of electrolytes are mostly reassuring, normal WBC.  I discussed the finding of the elevated lipase to patient.  She does admits to regular alcohol use including drinking wine daily with more consumption last night.  Since discomfort is to the left side of her abdomen and she has been having lipase in the setting of alcohol use, will obtain abdominal pelvis CT scan for further assessment.  9:02 AM Staff informed me that patient have eloped prior to  CT scan.  However, at this time there is no significant life-threatening concerns.  This patient presents to the ED for concern of abd pain, this involves an extensive number of treatment options, and is a complaint that carries with it a high risk of complications and morbidity.  The differential diagnosis includes PID, UTI, diverticulitis, colitis, pancreatitis, appendicitis, cholecystitis  Co morbidities that complicate the patient evaluation alcohol use  New sexual partners   Recurrent BV  External records from outside source obtained and reviewed including prior ER notes  Lab Tests:  I Ordered, and personally interpreted labs.  The pertinent results include:  as above  Imaging Studies ordered:  I ordered imaging studies including abd/pelvis CT, unfortunately patient eloped   Cardiac Monitoring:  The patient was maintained on a cardiac monitor.  I personally viewed and interpreted the cardiac monitored which showed an underlying rhythm of: NSR  Medicines ordered and prescription drug management:   Problem List / ED Course: abd pain  Reevaluation:  After the interventions noted above, I reevaluated the patient and found that they have :stayed the same  Social Determinants of Health: recurrent BV  Dispostion:  After consideration of the diagnostic results and the patients response to treatment, I feel that the patent would benefit from further evaluation but pt have  eloped.         Final Clinical Impression(s) / ED Diagnoses Final diagnoses:  LLQ abdominal pain  Vaginal discharge    Rx / DC Orders ED Discharge Orders     None         Domenic Moras, PA-C 05/03/21 [place], [place], DO 05/03/21 [phone]

## 2021-05-03 NOTE — ED Triage Notes (Addendum)
Pt reports LLQ abdominal pain that started about a week ago. Denies dysuria. Denies redness or itching in the area. Does endorse some white, thick vaginal discharge. Denies chance of pregnancy. LMP 04/15/21

## 2021-05-04 LAB — GC/CHLAMYDIA PROBE AMP (~~LOC~~) NOT AT ARMC
Chlamydia: NEGATIVE
Comment: NEGATIVE
Comment: NORMAL
Neisseria Gonorrhea: NEGATIVE

## 2021-07-02 ENCOUNTER — Other Ambulatory Visit: Payer: Self-pay

## 2021-07-02 ENCOUNTER — Emergency Department (HOSPITAL_COMMUNITY)
Admission: EM | Admit: 2021-07-02 | Discharge: 2021-07-02 | Disposition: A | Discharge status: Home / Self Care | Payer: Self-pay | Attending: Emergency Medicine | Admitting: Emergency Medicine

## 2021-07-02 ENCOUNTER — Encounter (HOSPITAL_COMMUNITY): Payer: Self-pay

## 2021-07-02 DIAGNOSIS — J020 Streptococcal pharyngitis: Secondary | ICD-10-CM

## 2021-07-02 LAB — GROUP A STREP BY PCR: Group A Strep by PCR: DETECTED — AB

## 2021-07-02 MED ORDER — PENICILLIN G BENZATHINE 1200000 UNIT/2ML IM SUSY
1.2000 10*6.[IU] | PREFILLED_SYRINGE | Freq: Once | INTRAMUSCULAR | Status: AC
  Administered 2021-07-02: 1.2 10*6.[IU] via INTRAMUSCULAR
  Filled 2021-07-02: qty 2

## 2021-07-02 NOTE — ED Triage Notes (Signed)
Pt reports with a sore throat since yesterday. Pt states that her kids have had strept throat since Sunday.  ?

## 2021-07-02 NOTE — ED Provider Notes (Signed)
?[facility] ?Provider Note ? ? ?CSN: [id number] ?Arrival date & time: 07/02/21  2207 ? ?  ? ?History ? ?Chief Complaint  ?Patient presents with  ? Sore Throat  ? ? ?[name] [name] is a 41 y.o. female who presents today for sore throat. ?She states that her kids have had strep throat over the past few days, both tested positive and started antibiotics.  She developed a sore throat last night.  She denies fever or headache.  No cough.  She states that she was well otherwise. ? ?HPI ? ?  ? ?Home Medications ?Prior to Admission medications   ?Medication Sig Start Date End Date Taking? Authorizing Provider  ?doxycycline (VIBRAMYCIN) 100 MG capsule Take 1 capsule (100 mg total) by mouth 2 (two) times daily. 10/20/20   Renne Crigler, PA-C  ?loperamide (IMODIUM) 2 MG capsule Take 1 capsule (2 mg total) by mouth 4 (four) times daily as needed for diarrhea or loose stools. 12/26/20   Valinda Hoar, NP  ?meclizine (ANTIVERT) 12.5 MG tablet Take 1 tablet (12.5 mg total) by mouth 3 (three) times daily as needed for dizziness. 12/26/20   Valinda Hoar, NP  ?metroNIDAZOLE (FLAGYL) 500 MG tablet Take 1 tablet (500 mg total) by mouth 2 (two) times daily. 12/26/20   Valinda Hoar, NP  ?Multiple Vitamins-Minerals (HAIR/SKIN/NAILS/BIOTIN PO) Take 1 tablet by mouth daily.    [provider]  ?   ? ?Allergies    ?Patient has no known allergies.   ? ?Review of Systems   ?Review of Systems ?See above ?Physical Exam ?Updated Vital Signs ?BP 135/79   Pulse 83   Temp (!) 97.5 ?F (36.4 ?C) (Oral)   Resp 17   Ht 5\' 4"  (1.626 m)   Wt 113.4 kg   SpO2 98%   BMI 42.91 kg/m?  ?Physical Exam ?Vitals and nursing note reviewed.  ?Constitutional:   ?   General: She is not in acute distress. ?   Appearance: She is not ill-appearing.  ?HENT:  ?   Head: Normocephalic and atraumatic.  ?   Mouth/Throat:  ?   Mouth: Mucous membranes are moist.  ?   Pharynx: Uvula midline. No uvula swelling.  ?    Tonsils: Tonsillar exudate present. No tonsillar abscesses. 2+ on the right. 2+ on the left.  ?   Comments: Normal phonation.  No trismus. ?Eyes:  ?   Conjunctiva/sclera: Conjunctivae normal.  ?Cardiovascular:  ?   Rate and Rhythm: Normal rate.  ?Pulmonary:  ?   Effort: Pulmonary effort is normal. No respiratory distress.  ?Skin: ?   General: Skin is warm and dry.  ?Neurological:  ?   Mental Status: She is alert.  ?Psychiatric:     ?   Mood and Affect: Mood normal.     ?   Behavior: Behavior normal.  ? ? ?ED Results / Procedures / Treatments   ?Labs ?(all labs ordered are listed, but only abnormal results are displayed) ?Labs Reviewed  ?GROUP A STREP BY PCR - Abnormal; Notable for the following components:  ?    Result Value  ? Group A Strep by PCR DETECTED (*)   ? All other components within normal limits  ? ? ?EKG ?None ? ?Radiology ?No results found. ? ?Procedures ?Procedures  ? ? ?Medications Ordered in ED ?Medications  ?penicillin g benzathine (BICILLIN LA) 1200000 UNIT/2ML injection 1.2 Million Units (has no administration in time range)  ? ? ?ED Course/ Medical Decision  Making/ A&P ?  ?                        ?Medical Decision Making ?Patient is a 41 year old woman who presents today for concern of strep throat.  She developed a sore throat last night and notes that both of her kids have been diagnosed with strep in the past week.  She denies any fevers or cough.  No headache.  Here her strep test is positive. ?Given that she has symptoms of sore throat, with known strep contacts, and exudative pharyngitis will treat. ?Patient adamantly requested IM penicillin instead of oral medications.  She denies any history of penicillin allergy stating that she has tolerated penicillins before and she does not have any allergies that she is aware of. ? ?She does not have evidence of complicated infection including no evidence of Ludwick's angina, PTA/RPA or deep spread of infection and is generally well-appearing. ?She  does not appear significantly dehydrated. ?We discussed the importance of adequate p.o. hydration. ?OTC meds as needed symptom control. ? ?Amount and/or Complexity of Data Reviewed ?Labs: ordered. ?   Details: strep positive ? ?Risk ?OTC drugs. ?Prescription drug management. ?Decision regarding hospitalization. ? ? ?Return precautions were discussed with patient who states their understanding.  At the time of discharge patient denied any unaddressed complaints or concerns.  Patient is agreeable for discharge home. ? ?Note: Portions of this report may have been transcribed using voice recognition software. Every effort was made to ensure accuracy; however, inadvertent computerized transcription errors may be present ? ? ? ? ? ? ? ? ?Final Clinical Impression(s) / ED Diagnoses ?Final diagnoses:  ?Strep throat  ? ? ?Rx / DC Orders ?ED Discharge Orders   ? ? None  ? ?  ? ? ?  ?Cristina Gong, [place] ?07/03/21 6546 ? ?  ?Palumbo, April, MD ?07/03/21 2357 ? ?

## 2021-07-02 NOTE — Discharge Instructions (Addendum)
You are contagious for 48 hours after starting the antibiotic treatment for strep. ?Please make sure you throw out your toothbrush as you can reinfect yourself. ? ?Lites make sure you are drinking plenty of water and staying well-hydrated. ? ?Please take Ibuprofen (Advil, motrin) and Tylenol (acetaminophen) to relieve your pain.   ? ?You may take up to 600 MG (3 pills) of normal strength ibuprofen every 8 hours as needed.   ?You make take tylenol, up to 1,000 mg (two extra strength pills) every 8 hours as needed.  ? ?It is safe to take ibuprofen and tylenol at the same time as they work differently.  ? Do not take more than 3,000 mg tylenol in a 24 hour period (not more than one dose every 8 hours.  Please check all medication labels as many medications such as pain and cold medications may contain tylenol.  Do not drink alcohol while taking these medications.  Do not take other NSAID'S while taking ibuprofen (such as aleve or naproxen).  Please take ibuprofen with food to decrease stomach upset. ? ?You may have diarrhea from the antibiotics.  It is very important that you continue to take the antibiotics even if you get diarrhea unless a medical professional tells you that you may stop taking them.  If you stop too early the bacteria you are being treated for will become stronger and you may need different, more powerful antibiotics that have more side effects and worsening diarrhea.  Please stay well hydrated and consider probiotics as they may decrease the severity of your diarrhea.  Please be aware that if you take any hormonal contraception (birth control pills, nexplanon, the ring, etc) that your birth control will not work while you are taking antibiotics and you need to use back up protection as directed on the birth control medication information insert.  ? ?

## 2022-02-27 ENCOUNTER — Other Ambulatory Visit: Payer: Self-pay

## 2022-02-27 ENCOUNTER — Encounter (HOSPITAL_COMMUNITY): Payer: Self-pay

## 2022-02-27 ENCOUNTER — Emergency Department (HOSPITAL_COMMUNITY): Payer: Self-pay

## 2022-02-27 ENCOUNTER — Emergency Department (HOSPITAL_COMMUNITY)
Admission: EM | Admit: 2022-02-27 | Discharge: 2022-02-27 | Disposition: A | Discharge status: Home / Self Care | Payer: Self-pay | Attending: Emergency Medicine | Admitting: Emergency Medicine

## 2022-02-27 DIAGNOSIS — M7989 Other specified soft tissue disorders: Secondary | ICD-10-CM | POA: Insufficient documentation

## 2022-02-27 DIAGNOSIS — S93431A Sprain of tibiofibular ligament of right ankle, initial encounter: Secondary | ICD-10-CM | POA: Insufficient documentation

## 2022-02-27 DIAGNOSIS — S93491A Sprain of other ligament of right ankle, initial encounter: Secondary | ICD-10-CM

## 2022-02-27 DIAGNOSIS — X501XXA Overexertion from prolonged static or awkward postures, initial encounter: Secondary | ICD-10-CM | POA: Insufficient documentation

## 2022-02-27 MED ORDER — IBUPROFEN 600 MG PO TABS: 600.0000 mg | ORAL_TABLET | Freq: Four times a day (QID) | ORAL | 0 refills | Status: DC | PRN

## 2022-02-27 MED ORDER — ACETAMINOPHEN 500 MG PO TABS: 500.0000 mg | ORAL_TABLET | Freq: Four times a day (QID) | ORAL | 0 refills | Status: DC | PRN

## 2022-02-27 NOTE — Discharge Instructions (Addendum)
You came to the emergency department with an injury to your right ankle.  Nothing is broken, it is more consistent with a sprain.  As we discussed you may alternate Tylenol and ibuprofen.  I have sent extra strength versions of both of these to your pharmacy.  Use the brace when you are out and about it at home you may take it off and ice to your ankle.  It was a pleasure to meet you and we hope you feel better!

## 2022-02-27 NOTE — ED Provider Notes (Signed)
[facility]-EMERGENCY DEPT Provider Note   CSN: [id number] Arrival date & time: 02/27/22  1909     History  Chief Complaint  Patient presents with   Ankle Injury    [name] [name] is a 41 y.o. female with no pertinent past medical history presenting today with pain to the right ankle.  She reports missing a step and rolling her ankle.  No numbness or tingling.  Able to bear weight however it is becoming increasingly painful.   Ankle Injury       Home Medications Prior to Admission medications   Medication Sig Start Date End Date Taking? Authorizing Provider  acetaminophen (TYLENOL) 500 MG tablet Take 1 tablet (500 mg total) by mouth every 6 (six) hours as needed. 02/27/22  Yes Flay Ghosh A, PA-C  ibuprofen (ADVIL) 600 MG tablet Take 1 tablet (600 mg total) by mouth every 6 (six) hours as needed. 02/27/22  Yes Talayah Picardi A, PA-C  doxycycline (VIBRAMYCIN) 100 MG capsule Take 1 capsule (100 mg total) by mouth 2 (two) times daily. 10/20/20   Renne Crigler, PA-C  loperamide (IMODIUM) 2 MG capsule Take 1 capsule (2 mg total) by mouth 4 (four) times daily as needed for diarrhea or loose stools. 12/26/20   Valinda Hoar, NP  meclizine (ANTIVERT) 12.5 MG tablet Take 1 tablet (12.5 mg total) by mouth 3 (three) times daily as needed for dizziness. 12/26/20   White, Elita Boone, NP  metroNIDAZOLE (FLAGYL) 500 MG tablet Take 1 tablet (500 mg total) by mouth 2 (two) times daily. 12/26/20   White, Elita Boone, NP  Multiple Vitamins-Minerals (HAIR/SKIN/NAILS/BIOTIN PO) Take 1 tablet by mouth daily.    [provider]      Allergies    Patient has no known allergies.    Review of Systems   Review of Systems  Physical Exam Updated Vital Signs BP 113/64   Pulse 82   Temp 98.2 F (36.8 C)   Resp 18   Ht 5\' 4"  (1.626 m)   Wt 113.4 kg   LMP 02/15/2022   SpO2 100%   BMI 42.91 kg/m  Physical Exam Vitals and nursing note reviewed.   Constitutional:      Appearance: Normal appearance.  HENT:     Head: Normocephalic and atraumatic.  Eyes:     General: No scleral icterus.    Conjunctiva/sclera: Conjunctivae normal.  Pulmonary:     Effort: Pulmonary effort is normal. No respiratory distress.  Musculoskeletal:     Comments: Full range of motion of right ankle.  Strong DP pulse.  Moving all MTPs.  Soft tissue swelling to the right lateral ankle  Skin:    Findings: No rash.  Neurological:     Mental Status: She is alert.  Psychiatric:        Mood and Affect: Mood normal.     ED Results / Procedures / Treatments   Labs (all labs ordered are listed, but only abnormal results are displayed) Labs Reviewed - No data to display  EKG None  Radiology DG Ankle Complete Right  Result Date: 02/27/2022 CLINICAL DATA:  Twisting injury today EXAM: RIGHT ANKLE - COMPLETE 3+ VIEW COMPARISON:  07/14/2016 FINDINGS: Frontal, oblique, and lateral views of the right ankle are obtained. No acute displaced fracture, subluxation, or dislocation. Mild midfoot osteoarthritis. Small superior and inferior calcaneal spurs. Soft tissues are unremarkable. IMPRESSION: 1. Mild osteoarthritis of the midfoot. 2. No acute displaced fracture. Electronically Signed   By: 09/13/2016  Manson Passey M.D.   On: 02/27/2022 20:37    Procedures Procedures   Medications Ordered in ED Medications - No data to display  ED Course/ Medical Decision Making/ A&P                           Medical Decision Making Amount and/or Complexity of Data Reviewed Radiology: ordered.  Risk OTC drugs. Prescription drug management.   41 year old female presenting today with right ankle injury.  Reports missing a step.  Unable to apply [place] ankle rule and x-ray was ordered.  Viewed and interpreted by me and I agree with radiology that there are no fractures.  Likely ATFL sprain.  Will be given lace up ankle brace and instructions for at home care.  She is agreeable to the  plan and will be discharged home at this time   Final Clinical Impression(s) / ED Diagnoses Final diagnoses:  Sprain of anterior talofibular ligament of right ankle, initial encounter    Rx / DC Orders ED Discharge Orders          Ordered    acetaminophen (TYLENOL) 500 MG tablet  Every 6 hours PRN        02/27/22 2048    ibuprofen (ADVIL) 600 MG tablet  Every 6 hours PRN        02/27/22 2048           Results and diagnoses were explained to the patient. Return precautions discussed in full. Patient had no additional questions and expressed complete understanding.   This chart was dictated using voice recognition software.  Despite best efforts to proofread,  errors can occur which can change the documentation meaning.    Saddie Benders, PA-C 02/27/22 2056    Virgina Norfolk, DO 02/27/22 2109

## 2022-02-27 NOTE — ED Triage Notes (Signed)
Getting ready for work this morning ~6AM and rolled right ankle.   Went to work and walked on it all day making pain intensify.

## 2022-04-01 ENCOUNTER — Ambulatory Visit (INDEPENDENT_AMBULATORY_CARE_PROVIDER_SITE_OTHER): Payer: 59 | Admitting: Podiatry

## 2022-04-01 DIAGNOSIS — M722 Plantar fascial fibromatosis: Secondary | ICD-10-CM

## 2022-04-01 DIAGNOSIS — B353 Tinea pedis: Secondary | ICD-10-CM

## 2022-04-01 MED ORDER — CLOTRIMAZOLE-BETAMETHASONE 1-0.05 % EX CREA: 1.0000 | TOPICAL_CREAM | Freq: Two times a day (BID) | CUTANEOUS | 0 refills | Status: AC

## 2022-04-01 MED ORDER — TERBINAFINE HCL 250 MG PO TABS: 250.0000 mg | ORAL_TABLET | Freq: Every day | ORAL | 0 refills | Status: AC

## 2022-04-01 NOTE — Progress Notes (Signed)
Subjective:  Patient ID: [name] [name], female    DOB: 05/24/1980,  MRN: [id number]  Chief Complaint  Patient presents with   Plantar Fasciitis    42 y.o. female presents with the above complaint.  Patient presents with heel pain that has been going for quite some time musculoskeletal worse worse with ambulation worse with pressure.  She would like to discuss treatment options for pain scale 7 out of 10.  She has not seen anyone as prior to seeing me for this.  She also has bilateral athlete's foot complaint.  She is tried over-the-counter medication which has not helped.  She would like to discuss other antifungal options   Review of Systems: Negative except as noted in the HPI. Denies N/V/F/Ch.  Past Medical History:  Diagnosis Date   BV (bacterial vaginosis)    Medical history non-contributory    Syphilis     Current Outpatient Medications:    clotrimazole-betamethasone (LOTRISONE) cream, Apply 1 Application topically 2 (two) times daily., Disp: 30 g, Rfl: 0   terbinafine (LAMISIL) 250 MG tablet, Take 1 tablet (250 mg total) by mouth daily., Disp: 30 tablet, Rfl: 0   acetaminophen (TYLENOL) 500 MG tablet, Take 1 tablet (500 mg total) by mouth every 6 (six) hours as needed., Disp: 30 tablet, Rfl: 0   doxycycline (VIBRAMYCIN) 100 MG capsule, Take 1 capsule (100 mg total) by mouth 2 (two) times daily., Disp: 14 capsule, Rfl: 0   ibuprofen (ADVIL) 600 MG tablet, Take 1 tablet (600 mg total) by mouth every 6 (six) hours as needed., Disp: 30 tablet, Rfl: 0   loperamide (IMODIUM) 2 MG capsule, Take 1 capsule (2 mg total) by mouth 4 (four) times daily as needed for diarrhea or loose stools., Disp: 12 capsule, Rfl: 0   meclizine (ANTIVERT) 12.5 MG tablet, Take 1 tablet (12.5 mg total) by mouth 3 (three) times daily as needed for dizziness., Disp: 30 tablet, Rfl: 0   metroNIDAZOLE (FLAGYL) 500 MG tablet, Take 1 tablet (500 mg total) by mouth 2 (two) times daily., Disp: 14 tablet, Rfl: 0    Multiple Vitamins-Minerals (HAIR/SKIN/NAILS/BIOTIN PO), Take 1 tablet by mouth daily., Disp: , Rfl:   Social History   Tobacco Use  Smoking Status Former   Types: Cigarettes  Smokeless Tobacco Never    No Known Allergies Objective:  There were no vitals filed for this visit. There is no height or weight on file to calculate BMI. Constitutional Well developed. Well nourished.  Vascular Dorsalis pedis pulses palpable bilaterally. Posterior tibial pulses palpable bilaterally. Capillary refill normal to all digits.  No cyanosis or clubbing noted. Pedal hair growth normal.  Neurologic Normal speech. Oriented to person, place, and time. Epicritic sensation to light touch grossly present bilaterally.  Dermatologic Epidermal lysis noted with subjective complaint of itching noted to bilateral plantar foot.  No open wounds or lesion noted  Orthopedic: Normal joint ROM without pain or crepitus bilaterally. No visible deformities. Tender to palpation at the calcaneal tuber bilaterally. No pain with calcaneal squeeze bilaterally. Ankle ROM diminished range of motion bilaterally. Silfverskiold Test: positive bilaterally.   None  Assessment:   1. Plantar fasciitis of right foot   2. Plantar fasciitis of left foot   3. Tinea pedis of both feet    Plan:  Patient was evaluated and treated and all questions answered.  Bilateral athlete's foot -I explained to patient the etiology of athlete's foot but treatment options were discussed.  Given the amount of athlete's foot that  is present patient benefit from Lamisil for 30 days as well as Lotrisone cream to be applied twice a day.  She states understanding  Plantar Fasciitis, bilaterally - XR reviewed as above.  - Educated on icing and stretching. Instructions given.  - Injection delivered to the plantar fascia as below. - DME: Plantar fascial brace dispensed to support the medial longitudinal arch of the foot and offload pressure from  the heel and prevent arch collapse during weightbearing - Pharmacologic management: None  Procedure: Injection Tendon/Ligament Location: Bilateral plantar fascia at the glabrous junction; medial approach. Skin Prep: alcohol Injectate: 0.5 cc 0.5% marcaine plain, 0.5 cc of 1% Lidocaine, 0.5 cc kenalog 10. Disposition: Patient tolerated procedure well. Injection site dressed with a band-aid.  No follow-ups on file.

## 2022-04-29 ENCOUNTER — Encounter: Payer: Self-pay | Admitting: Podiatry

## 2022-04-29 ENCOUNTER — Ambulatory Visit (INDEPENDENT_AMBULATORY_CARE_PROVIDER_SITE_OTHER): Payer: 59 | Admitting: Podiatry

## 2022-04-29 DIAGNOSIS — M722 Plantar fascial fibromatosis: Secondary | ICD-10-CM

## 2022-04-29 DIAGNOSIS — Q666 Other congenital valgus deformities of feet: Secondary | ICD-10-CM

## 2022-04-29 NOTE — Progress Notes (Signed)
Subjective:  Patient ID: [name] [name], female    DOB: 08-Jan-1981,  MRN: [id number]  Chief Complaint  Patient presents with   Plantar Fasciitis    42 y.o. female presents with the above complaint.  Patient presents for follow-up of bilateral Planter fasciitis.  She states the injection helped considerably.  She does not have any further pain.  She wanted discussed prevention of option.  She does not wear any orthotics.  Denies any other acute complaints.   Review of Systems: Negative except as noted in the HPI. Denies N/V/F/Ch.  Past Medical History:  Diagnosis Date   BV (bacterial vaginosis)    Medical history non-contributory    Syphilis     Current Outpatient Medications:    acetaminophen (TYLENOL) 500 MG tablet, Take 1 tablet (500 mg total) by mouth every 6 (six) hours as needed., Disp: 30 tablet, Rfl: 0   clotrimazole-betamethasone (LOTRISONE) cream, Apply 1 Application topically 2 (two) times daily., Disp: 30 g, Rfl: 0   doxycycline (VIBRAMYCIN) 100 MG capsule, Take 1 capsule (100 mg total) by mouth 2 (two) times daily., Disp: 14 capsule, Rfl: 0   ibuprofen (ADVIL) 600 MG tablet, Take 1 tablet (600 mg total) by mouth every 6 (six) hours as needed., Disp: 30 tablet, Rfl: 0   loperamide (IMODIUM) 2 MG capsule, Take 1 capsule (2 mg total) by mouth 4 (four) times daily as needed for diarrhea or loose stools., Disp: 12 capsule, Rfl: 0   meclizine (ANTIVERT) 12.5 MG tablet, Take 1 tablet (12.5 mg total) by mouth 3 (three) times daily as needed for dizziness., Disp: 30 tablet, Rfl: 0   metroNIDAZOLE (FLAGYL) 500 MG tablet, Take 1 tablet (500 mg total) by mouth 2 (two) times daily., Disp: 14 tablet, Rfl: 0   Multiple Vitamins-Minerals (HAIR/SKIN/NAILS/BIOTIN PO), Take 1 tablet by mouth daily., Disp: , Rfl:    terbinafine (LAMISIL) 250 MG tablet, Take 1 tablet (250 mg total) by mouth daily., Disp: 30 tablet, Rfl: 0  Social History   Tobacco Use  Smoking Status Former   Types:  Cigarettes  Smokeless Tobacco Never    No Known Allergies Objective:  There were no vitals filed for this visit. There is no height or weight on file to calculate BMI. Constitutional Well developed. Well nourished.  Vascular Dorsalis pedis pulses palpable bilaterally. Posterior tibial pulses palpable bilaterally. Capillary refill normal to all digits.  No cyanosis or clubbing noted. Pedal hair growth normal.  Neurologic Normal speech. Oriented to person, place, and time. Epicritic sensation to light touch grossly present bilaterally.  Dermatologic Epidermal lysis noted with subjective complaint of itching noted to bilateral plantar foot.  No open wounds or lesion noted  Orthopedic: Normal joint ROM without pain or crepitus bilaterally. No visible deformities. Tender to palpation at the calcaneal tuber bilaterally. No pain with calcaneal squeeze bilaterally. Ankle ROM diminished range of motion bilaterally. Silfverskiold Test: positive bilaterally.   None  Assessment:   1. Plantar fasciitis of right foot   2. Plantar fasciitis of left foot   3. Pes planovalgus     Plan:  Patient was evaluated and treated and all questions answered.  Bilateral athlete's foot -I explained to patient the etiology of athlete's foot but treatment options were discussed.  Given the amount of athlete's foot that is present patient benefit from Lamisil for 30 days as well as Lotrisone cream to be applied twice a day.  She states understanding  Plantar Fasciitis, bilaterally -Niccoli healed and is doing well with  1 steroid injection.  At this time I discussed prevention I discussed orthotics option.  If any foot and ankle issues on future she will come back and see me.  Pes planovalgus -I explained to patient the etiology of pes planovalgus and relationship with Planter fasciitis and various treatment options were discussed.  Given patient foot structure in the setting of Planter fasciitis I  believe patient will benefit from custom-made orthotics to help control the hindfoot motion support the arch of the foot and take the stress away from plantar fascial.  Patient agrees with the plan like to proceed with orthotics -Patient was casted for orthotics   No follow-ups on file.

## 2022-05-19 ENCOUNTER — Ambulatory Visit (INDEPENDENT_AMBULATORY_CARE_PROVIDER_SITE_OTHER): Payer: 59 | Admitting: Plastic Surgery

## 2022-05-19 ENCOUNTER — Encounter: Payer: Self-pay | Admitting: Plastic Surgery

## 2022-05-19 DIAGNOSIS — Z6841 Body Mass Index (BMI) 40.0 and over, adult: Secondary | ICD-10-CM | POA: Diagnosis not present

## 2022-05-19 DIAGNOSIS — R21 Rash and other nonspecific skin eruption: Secondary | ICD-10-CM | POA: Diagnosis not present

## 2022-05-19 DIAGNOSIS — F17210 Nicotine dependence, cigarettes, uncomplicated: Secondary | ICD-10-CM | POA: Diagnosis not present

## 2022-05-19 DIAGNOSIS — E65 Localized adiposity: Secondary | ICD-10-CM

## 2022-05-19 NOTE — Progress Notes (Signed)
Referring Provider Jonathon Bellows, PA-C [id number]  [place] [address]   CC:  Chief Complaint  Patient presents with   Consult      [name] [name] is an 42 y.o. female.  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  She presents today requesting anterior abdominal wall contouring.  She is unhappy with the appearance of her abdominal wall and notes that she occasionally has rashes underneath her pannus.  No Known Allergies  Outpatient Encounter Medications as of 05/19/2022  Medication Sig   clotrimazole-betamethasone (LOTRISONE) cream Apply 1 Application topically 2 (two) times daily.   meclizine (ANTIVERT) 12.5 MG tablet Take 1 tablet (12.5 mg total) by mouth 3 (three) times daily as needed for dizziness.   Multiple Vitamins-Minerals (HAIR/SKIN/NAILS/BIOTIN PO) Take 1 tablet by mouth daily.   terbinafine (LAMISIL) 250 MG tablet Take 1 tablet (250 mg total) by mouth daily.   acetaminophen (TYLENOL) 500 MG tablet Take 1 tablet (500 mg total) by mouth every 6 (six) hours as needed.   doxycycline (VIBRAMYCIN) 100 MG capsule Take 1 capsule (100 mg total) by mouth 2 (two) times daily.   ibuprofen (ADVIL) 600 MG tablet Take 1 tablet (600 mg total) by mouth every 6 (six) hours as needed.   loperamide (IMODIUM) 2 MG capsule Take 1 capsule (2 mg total) by mouth 4 (four) times daily as needed for diarrhea or loose stools.   metroNIDAZOLE (FLAGYL) 500 MG tablet Take 1 tablet (500 mg total) by mouth 2 (two) times daily.   No facility-administered encounter medications on file as of 05/19/2022.     Past Medical History:  Diagnosis Date   BV (bacterial vaginosis)    Medical history non-contributory    Syphilis     Past Surgical History:  Procedure Laterality Date   CESAREAN SECTION     CESAREAN SECTION N/A 09/04/2013   Procedure: CESAREAN SECTION;  Surgeon: Emily Filbert, MD;  Location: [facility];   Service: Obstetrics;  Laterality: N/A;   DILATION AND CURETTAGE OF UTERUS     DILATION AND EVACUATION N/A 09/12/2012   Procedure: DILATATION AND EVACUATION;  Surgeon: Woodroe Mode, MD;  Location: [facility];  Service: Gynecology;  Laterality: N/A;    Family History  Problem Relation Age of Onset   Diabetes Mother    Hypertension Mother    Hypertension Father    Stroke Maternal Aunt    Hearing loss Neg Hx     Social History   Social History Narrative   Not on file     Review of Systems General: Denies fevers, chills, weight loss CV: Denies chest pain, shortness of breath, palpitations Abdomen: Significant anterior abdominal wall subcutaneous fat and skin  Physical Exam    05/19/2022    9:58 AM 02/27/2022    8:15 PM 02/27/2022    8:12 PM  Vitals with BMI  Height '5\' 4"'$  '5\' 4"'$    Weight 251 lbs 10 oz 250 lbs   BMI 123456 [id number]   Systolic 0000000  [id number]  Diastolic 80  64  Pulse 82  82    General:  No acute distress,  Alert and oriented, Non-Toxic, Normal speech and affect Abdomen: The patient has significant anterior abdominal wall skin and fat.  She does not have any rashes today Mammogram: No mammograms yet Assessment/Plan Pannus: The patient has a reasonably large pannus but it does not extend down very far  and she does not have evidence of panniculitis today.  She is actually more interested in improving the appearance of her abdominal wall and to do this she would get a much better result with an abdominoplasty.  Unfortunately she is not currently a candidate for an abdominoplasty due to the significant amount of subcutaneous fat and intra-abdominal fat and her smoking.  We spent greater than 20 minutes discussing the need to decrease her overall body weight but specifically the subcutaneous fat on her abdominal wall if she wants to have as good of an aesthetic result as possible.  Currently it would be impossible to achieve her aesthetic goals.  We discussed strategies for weight loss  including increasing the intensity of her cardio, increasing her strength and resistance training and adding healthier foods to her diet.  Specifically I encouraged her to have a plant predominant diet with 20% of her diet made up of protein.  She also is encouraged to speak to her primary care provider regarding the new weight loss medications or bariatric surgery.  Additionally she is asked to speak to her primary care doctor regarding assistance with tobacco cessation as she is not a candidate for any aesthetic surgery while she is a smoker regardless of how much weight she loses.  She understands all of this and is in agreement.  She will return to see me in 2 to 3 months [name] that we can follow her weight loss.  Camillia Herter 05/19/2022, 11:22 AM

## 2022-05-29 ENCOUNTER — Other Ambulatory Visit: Payer: Self-pay

## 2022-05-29 ENCOUNTER — Emergency Department (HOSPITAL_COMMUNITY)
Admission: EM | Admit: 2022-05-29 | Discharge: 2022-05-29 | Discharge status: Against Medical Advice | Payer: 59 | Attending: Emergency Medicine | Admitting: Emergency Medicine

## 2022-05-29 DIAGNOSIS — N898 Other specified noninflammatory disorders of vagina: Secondary | ICD-10-CM

## 2022-05-29 DIAGNOSIS — B9689 Other specified bacterial agents as the cause of diseases classified elsewhere: Secondary | ICD-10-CM | POA: Diagnosis not present

## 2022-05-29 DIAGNOSIS — N899 Noninflammatory disorder of vagina, unspecified: Secondary | ICD-10-CM | POA: Diagnosis not present

## 2022-05-29 DIAGNOSIS — N760 Acute vaginitis: Secondary | ICD-10-CM | POA: Insufficient documentation

## 2022-05-29 LAB — WET PREP, GENITAL
Trich, Wet Prep: NEGATIVE — AB
WBC, Wet Prep HPF POC: <10 (ref <10)
Yeast Wet Prep HPF POC: NEGATIVE — AB

## 2022-05-29 LAB — URINALYSIS, ROUTINE W REFLEX MICROSCOPIC
Bilirubin Urine: NEGATIVE
Glucose, UA: NEGATIVE mg/dL
Hgb urine dipstick: NEGATIVE
Ketones, ur: NEGATIVE mg/dL
Leukocytes,Ua: NEGATIVE
Nitrite: NEGATIVE
Protein, ur: NEGATIVE mg/dL
Specific Gravity, Urine: 1.025 (ref 1.005–1.030)
pH: 5 (ref 5.0–8.0)

## 2022-05-29 LAB — RAPID HIV SCREEN (HIV 1/2 AB+AG)
HIV 1/2 Antibodies: NONREACTIVE
HIV-1 P24 Antigen - HIV24: NONREACTIVE

## 2022-05-29 MED ORDER — METRONIDAZOLE 500 MG PO TABS: 500.0000 mg | ORAL_TABLET | Freq: Two times a day (BID) | ORAL | 0 refills | Status: DC

## 2022-05-29 NOTE — ED Notes (Signed)
PA notified of pelvic supplies at bedside.

## 2022-05-29 NOTE — ED Provider Notes (Signed)
[place] AT [facility] Provider Note   CSN: [id number] Arrival date & time: 05/29/22  1713     History  Chief Complaint  Patient presents with   Abscess    [name] [name] is a 42 y.o. female presents to the ED complaining of a "bump" in her vaginal area.  She states that she can feel a mass between her vagina and anus. She states it is not painful and she has not had any drainage from the area.  She states she is sexually active with one partner who is a female.  She has not waxed, shaved, changed soaps, detergent, or undergarments.  She states the only new things she has done is diet, exercise, and take vitamins.  She would like testing for STD/STI as she has had these in the past.  Denies fever, chills, vaginal discharge, vaginal bleeding, dysuria, vaginal pain, pelvic pain, hematuria.         Home Medications Prior to Admission medications   Medication Sig Start Date End Date Taking? Authorizing Provider  metroNIDAZOLE (FLAGYL) 500 MG tablet Take 1 tablet (500 mg total) by mouth 2 (two) times daily. 05/29/22  Yes Kamdyn Covel R, PA  acetaminophen (TYLENOL) 500 MG tablet Take 1 tablet (500 mg total) by mouth every 6 (six) hours as needed. 02/27/22   Redwine, Madison A, PA-C  clotrimazole-betamethasone (LOTRISONE) cream Apply 1 Application topically 2 (two) times daily. 04/01/22   Felipa Furnace, DPM  doxycycline (VIBRAMYCIN) 100 MG capsule Take 1 capsule (100 mg total) by mouth 2 (two) times daily. 10/20/20   Carlisle Cater, PA-C  ibuprofen (ADVIL) 600 MG tablet Take 1 tablet (600 mg total) by mouth every 6 (six) hours as needed. 02/27/22   Redwine, Madison A, PA-C  loperamide (IMODIUM) 2 MG capsule Take 1 capsule (2 mg total) by mouth 4 (four) times daily as needed for diarrhea or loose stools. 12/26/20   Hans Eden, NP  meclizine (ANTIVERT) 12.5 MG tablet Take 1 tablet (12.5 mg total) by mouth 3 (three) times daily as needed for dizziness.  12/26/20   White, Leitha Schuller, NP  Multiple Vitamins-Minerals (HAIR/SKIN/NAILS/BIOTIN PO) Take 1 tablet by mouth daily.    [provider]  terbinafine (LAMISIL) 250 MG tablet Take 1 tablet (250 mg total) by mouth daily. 04/01/22   Felipa Furnace, DPM      Allergies    Patient has no known allergies.    Review of Systems   Review of Systems  Genitourinary:  Negative for dysuria, hematuria, pelvic pain, vaginal bleeding, vaginal discharge and vaginal pain.       Mass between vagina and anus    Physical Exam Updated Vital Signs BP (!) 164/110 (BP Location: Left Arm)   Pulse 92   Temp 98.2 F (36.8 C) (Oral)   Resp 18   Ht 5\' 4"  (1.626 m)   Wt 115 kg   SpO2 100%   BMI 43.52 kg/m  Physical Exam Vitals and nursing note reviewed. Exam conducted with a chaperone present.  Constitutional:      General: She is not in acute distress.    Appearance: Normal appearance. She is not ill-appearing or diaphoretic.  Cardiovascular:     Rate and Rhythm: Normal rate and regular rhythm.  Pulmonary:     Effort: Pulmonary effort is normal.  Abdominal:     General: Abdomen is flat.     Palpations: Abdomen is soft.     Tenderness: There  is no abdominal tenderness.  Genitourinary:    Exam position: Lithotomy position.     Pubic Area: No rash.      Labia:        Right: Lesion present. No rash, tenderness or injury.        Left: No lesion.      Vagina: Normal. No vaginal discharge, tenderness, bleeding or lesions.     Cervix: Discharge (whitish, mucus-like) present. No cervical motion tenderness, friability, lesion or erythema.    Neurological:     Mental Status: She is alert. Mental status is at baseline.  Psychiatric:        Mood and Affect: Mood normal.        Behavior: Behavior normal.     ED Results / Procedures / Treatments   Labs (all labs ordered are listed, but only abnormal results are displayed) Labs Reviewed  WET PREP, GENITAL - Abnormal; Notable for the following  components:      Result Value   Yeast Wet Prep HPF POC NEGATIVE (*)    Trich, Wet Prep NEGATIVE (*)    Clue Cells Wet Prep HPF POC PRESENT (*)    All other components within normal limits  URINALYSIS, ROUTINE W REFLEX MICROSCOPIC - Abnormal; Notable for the following components:   APPearance HAZY (*)    All other components within normal limits  HSV CULTURE AND TYPING  RAPID HIV SCREEN (HIV 1/2 AB+AG)  RPR  GC/CHLAMYDIA PROBE AMP ([place]) NOT AT [facility]    EKG None  Radiology No results found.  Procedures Procedures    Medications Ordered in ED Medications - No data to display  ED Course/ Medical Decision Making/ A&P                             Medical Decision Making Amount and/or Complexity of Data Reviewed Labs: ordered.  Risk Prescription drug management.   This patient presents to the ED with chief complaint(s) of painless lesion in the vaginal area with pertinent past medical history of previous STI/STD, obesity, tobacco abuse.  The complaint involves an extensive differential diagnosis and also carries with it a high risk of complications and morbidity.    The differential diagnosis includes BV, gonorrhea, chlamydia, trichomoniasis, HHV-2, HPV, irritation, contact dermatitis   The initial plan is to obtain STD/STI work-up and perform pelvic exam  Initial Assessment:   Exam significant for a small cluster of pustular papules on the inferior right labia near the vaginal introitus.  No fluid is able to be expressed from these papules.  There is a minimal amount of thick, white colored mucus discharge in the vagina coming from the cervix with minimal odor.  Cervix without CMT, friability, bleeding.  No rashes, erythema, swelling, or pain with external GU exam.    Independent ECG/labs interpretation:  The following labs were independently interpreted:  Clue cells present, positive for bacterial vaginosis.  Negative for trich and yeast.  HIV non-reactive.  UA  without UTI.  GC/Chlamydia pending.  HSV culture and typing also ordered.   Treatment and Reassessment: Informed by RN that patient left before being notified of results and discussing treatment with her.  Will notify patient of results and send metronidazole prescription to pharmacy for treatment of BV.    Disposition:   Discussed with patient follow-up with her OBGYN during pelvic examination, prior to patient eloping.  Patient will be notified of test results when they become available.  Patient eloped from ED prior to completion of visit.  Flagyl sent to pharmacy to treat BV.  Social Determinants of Health:   Patient's  tobacco use   increases the complexity of managing their presentation         Final Clinical Impression(s) / ED Diagnoses Final diagnoses:  Bacterial vaginosis  Vaginal lesion    Rx / DC Orders ED Discharge Orders          Ordered    metroNIDAZOLE (FLAGYL) 500 MG tablet  2 times daily        05/29/22 2025              Pat Kocher, [place] 05/29/22 2029    Lacretia Leigh, MD 05/30/22 1605

## 2022-05-29 NOTE — ED Notes (Signed)
Per provider request, this RN called pt's cell # and left a message stating we received some of her test results and prescriptions were being called to her pharmacy, but she should return call to [facility] charge RN to verify that she received message. Provider aware. Ysidro Evert, charge RN aware

## 2022-05-29 NOTE — ED Triage Notes (Signed)
Pt reports "bump" in vaginal area. Denies pain or drainage

## 2022-05-30 LAB — RPR: RPR Ser Ql: NONREACTIVE

## 2022-05-31 LAB — GC/CHLAMYDIA PROBE AMP (~~LOC~~) NOT AT ARMC
Chlamydia: NEGATIVE
Comment: NEGATIVE
Comment: NORMAL
Neisseria Gonorrhea: NEGATIVE

## 2022-06-02 ENCOUNTER — Encounter: Payer: 59 | Admitting: Nurse Practitioner

## 2022-06-15 ENCOUNTER — Encounter: Payer: Self-pay | Admitting: Nurse Practitioner

## 2022-06-15 ENCOUNTER — Ambulatory Visit (INDEPENDENT_AMBULATORY_CARE_PROVIDER_SITE_OTHER): Payer: 59 | Admitting: Nurse Practitioner

## 2022-06-15 VITALS — BP 133/71 | HR 79 | Temp 98.2°F | Ht 64.0 in | Wt 242.0 lb

## 2022-06-15 DIAGNOSIS — E65 Localized adiposity: Secondary | ICD-10-CM

## 2022-06-15 DIAGNOSIS — Z0289 Encounter for other administrative examinations: Secondary | ICD-10-CM

## 2022-06-15 DIAGNOSIS — Z6841 Body Mass Index (BMI) 40.0 and over, adult: Secondary | ICD-10-CM | POA: Diagnosis not present

## 2022-06-15 NOTE — Progress Notes (Signed)
Office: [phone]  /  Fax: [phone]   Initial Visit  [name] [name] was seen in clinic today to evaluate for obesity. She is interested in losing weight to improve overall health and reduce the risk of weight related complications. She presents today to review program treatment options, initial physical assessment, and evaluation.     She was referred by: Specialist  When asked what else they would like to accomplish? She states: Lose a target amount of weight : 175 lbs. Patient would like to lose weight to proceed with plastic surgery.  Weight history:  She started gaining weight after having her children and starting birth control in her 15s.   When asked how has your weight affected you? She states: Having fatigue and Having poor endurance  Some associated conditions: None  Contributing factors: Family history, Medications, and Life event  Weight promoting medications identified: Contraceptives or hormonal therapy  Current nutrition plan:  She is trying to eat more protein and lower carbs. She is making healthier choices and watching her portion sizes.   Current level of physical activity: She is weight lifting & cardio 5 days per week-started 1 month ago.    Current or previous pharmacotherapy: None  She has tried OTC weight loss meds.   Response to medication: Never tried medications   Past medical history includes:   Past Medical History:  Diagnosis Date   BV (bacterial vaginosis)    Medical history non-contributory    Syphilis      Objective:   BP 133/71   Pulse 79   Temp 98.2 F (36.8 C)   Ht 5\' 4"  (1.626 m)   Wt 242 lb (109.8 kg)   LMP 06/02/2022 (Approximate)   SpO2 99%   BMI 41.54 kg/m  She was weighed on the bioimpedance scale: Body mass index is 41.54 kg/m.  Peak Weight:260s , Body Fat%:46.3, Visceral Fat Rating:13, Weight trend over the last 12 months: Decreasing  General:  Alert, oriented and cooperative. Patient is in no acute distress.   Respiratory: Normal respiratory effort, no problems with respiration noted   Gait: able to ambulate independently  Mental Status: Normal mood and affect. Normal behavior. Normal judgment and thought content.   DIAGNOSTIC DATA REVIEWED:  BMET    Component Value Date/Time   NA 135 05/03/2021 0700   K 4.0 05/03/2021 0700   CL 103 05/03/2021 0700   CO2 26 05/03/2021 0700   GLUCOSE 116 (H) 05/03/2021 0700   BUN 13 05/03/2021 0700   CREATININE 0.78 05/03/2021 0700   CALCIUM 8.6 (L) 05/03/2021 0700   GFRNONAA >60 05/03/2021 0700   GFRAA >60 01/18/2017 2144   Lab Results  Component Value Date   HGBA1C  02/14/2010    5.4 (NOTE)                                                                       According to the ADA Clinical Practice Recommendations for 2011, when HbA1c is used as a screening test:   >=6.5%   Diagnostic of Diabetes Mellitus           (if abnormal result  is confirmed)  5.7-6.4%   Increased risk of developing Diabetes Mellitus  References:Diagnosis and Classification of Diabetes  [place],Diabetes Care,2011,34(Suppl 1):S62-S69 and Standards of Medical Care in         Diabetes - 2011,Diabetes Care,2011,34  (Suppl 1):S11-S61.   No results found for: "INSULIN" CBC    Component Value Date/Time   WBC 6.4 05/03/2021 0700   RBC 3.85 (L) 05/03/2021 0700   HGB 11.7 (L) 05/03/2021 0700   HCT 36.7 05/03/2021 0700   PLT 349 05/03/2021 0700   MCV 95.3 05/03/2021 0700   MCH 30.4 05/03/2021 0700   MCHC 31.9 05/03/2021 0700   RDW 12.9 05/03/2021 0700   Iron/TIBC/Ferritin/ %Sat No results found for: "IRON", "TIBC", "FERRITIN", "IRONPCTSAT" Lipid Panel  No results found for: "CHOL", "TRIG", "HDL", "CHOLHDL", "VLDL", "LDLCALC", "LDLDIRECT" Hepatic Function Panel     Component Value Date/Time   PROT 7.6 05/03/2021 0700   ALBUMIN 3.6 05/03/2021 0700   AST 19 05/03/2021 0700   ALT 19 05/03/2021 0700   ALKPHOS 85 05/03/2021 0700   BILITOT 0.7 05/03/2021 0700      Component  Value Date/Time   TSH 1.225 09/16/2014 1412     Assessment and Plan:   Abdominal pannus Recently saw Dr. Ladona Ridgel and was referred here to lose weight prior to proceeding with surgery.    Morbid obesity  BMI 40.0-44.9, adult        Obesity Treatment / Action Plan:  Patient will work on garnering support from family and friends to begin weight loss journey. Will work on eliminating or reducing the presence of highly palatable, calorie dense foods in the home. Will complete provided nutritional and psychosocial assessment questionnaire before the next appointment. Will be scheduled for indirect calorimetry to determine resting energy expenditure in a fasting state.  This will allow [place] to create a reduced calorie, high-protein meal plan to promote loss of fat mass while preserving muscle mass.  Obesity Education Performed Today:  She was weighed on the bioimpedance scale and results were discussed and documented in the synopsis.  We discussed obesity as a disease and the importance of a more detailed evaluation of all the factors contributing to the disease.  We discussed the importance of long term lifestyle changes which include nutrition, exercise and behavioral modifications as well as the importance of customizing this to her specific health and social needs.  We discussed the benefits of reaching a healthier weight to alleviate the symptoms of existing conditions and reduce the risks of the biomechanical, metabolic and psychological effects of obesity.  [name] [name] appears to be in the action stage of change and states they are ready to start intensive lifestyle modifications and behavioral modifications.  30 minutes was spent today on this visit including the above counseling, pre-visit chart review, and post-visit documentation.  Reviewed by clinician on day of visit: allergies, medications, problem list, medical history, surgical history, family history, social  history, and previous encounter notes pertinent to obesity diagnosis.    Theodis Sato Ventura [place] FNP-C

## 2022-06-16 ENCOUNTER — Telehealth: Payer: Self-pay | Admitting: Podiatry

## 2022-06-16 ENCOUNTER — Ambulatory Visit (INDEPENDENT_AMBULATORY_CARE_PROVIDER_SITE_OTHER): Payer: 59 | Admitting: Podiatry

## 2022-06-16 DIAGNOSIS — M722 Plantar fascial fibromatosis: Secondary | ICD-10-CM

## 2022-06-16 NOTE — Telephone Encounter (Signed)
Patient was in the office today for OPU and would like a refill on Lamisil. She stated she has had labs done.  Please advise

## 2022-06-16 NOTE — Progress Notes (Signed)
Patient presents today to pick up custom molded foot orthotics recommended by Dr. PATEL.   Orthotics were dispensed and fit was satisfactory. Reviewed instructions for break-in and wear. Written instructions given to patient.  Patient will follow up as needed.   

## 2022-06-22 ENCOUNTER — Other Ambulatory Visit: Payer: Self-pay

## 2022-06-22 DIAGNOSIS — Z79899 Other long term (current) drug therapy: Secondary | ICD-10-CM

## 2022-07-13 ENCOUNTER — Ambulatory Visit: Payer: 59 | Admitting: Bariatrics

## 2022-07-19 ENCOUNTER — Ambulatory Visit: Payer: 59 | Admitting: Plastic Surgery

## 2022-07-27 ENCOUNTER — Ambulatory Visit: Payer: 59 | Admitting: Nurse Practitioner
